# Patient Record
Sex: Male | Born: 1965
Health system: Southern US, Community
[De-identification: ages and names within clinical notes are randomized; demographics above are authoritative.]

## PROBLEM LIST (undated history)

## (undated) DIAGNOSIS — E7800 Pure hypercholesterolemia, unspecified: Secondary | ICD-10-CM

## (undated) DIAGNOSIS — E119 Type 2 diabetes mellitus without complications: Secondary | ICD-10-CM

---

## 1998-12-03 ENCOUNTER — Encounter: Payer: Self-pay | Admitting: Emergency Medicine

## 1998-12-03 ENCOUNTER — Emergency Department (HOSPITAL_COMMUNITY): Admission: EM | Admit: 1998-12-03 | Discharge: 1998-12-03 | Payer: Self-pay | Admitting: Emergency Medicine

## 1999-03-06 ENCOUNTER — Encounter: Payer: Self-pay | Admitting: Emergency Medicine

## 1999-03-06 ENCOUNTER — Emergency Department (HOSPITAL_COMMUNITY): Admission: EM | Admit: 1999-03-06 | Discharge: 1999-03-06 | Payer: Self-pay | Admitting: Emergency Medicine

## 1999-12-17 ENCOUNTER — Emergency Department (HOSPITAL_COMMUNITY): Admission: EM | Admit: 1999-12-17 | Discharge: 1999-12-17 | Payer: Self-pay | Admitting: Emergency Medicine

## 2001-09-09 ENCOUNTER — Emergency Department (HOSPITAL_COMMUNITY): Admission: EM | Admit: 2001-09-09 | Discharge: 2001-09-09 | Payer: Self-pay | Admitting: Emergency Medicine

## 2003-04-04 ENCOUNTER — Emergency Department (HOSPITAL_COMMUNITY): Admission: EM | Admit: 2003-04-04 | Discharge: 2003-04-04 | Payer: Self-pay | Admitting: Emergency Medicine

## 2003-04-04 ENCOUNTER — Encounter: Payer: Self-pay | Admitting: *Deleted

## 2003-07-10 ENCOUNTER — Emergency Department (HOSPITAL_COMMUNITY): Admission: EM | Admit: 2003-07-10 | Discharge: 2003-07-10 | Payer: Self-pay | Admitting: Emergency Medicine

## 2004-08-19 ENCOUNTER — Emergency Department (HOSPITAL_COMMUNITY): Admission: EM | Admit: 2004-08-19 | Discharge: 2004-08-19 | Payer: Self-pay | Admitting: Emergency Medicine

## 2005-01-02 ENCOUNTER — Emergency Department (HOSPITAL_COMMUNITY): Admission: EM | Admit: 2005-01-02 | Discharge: 2005-01-02 | Payer: Self-pay | Admitting: Emergency Medicine

## 2005-11-02 ENCOUNTER — Emergency Department (HOSPITAL_COMMUNITY): Admission: EM | Admit: 2005-11-02 | Discharge: 2005-11-02 | Payer: Self-pay | Admitting: Emergency Medicine

## 2006-06-14 ENCOUNTER — Emergency Department (HOSPITAL_COMMUNITY): Admission: EM | Admit: 2006-06-14 | Discharge: 2006-06-14 | Payer: Self-pay | Admitting: Emergency Medicine

## 2006-07-08 ENCOUNTER — Emergency Department (HOSPITAL_COMMUNITY): Admission: EM | Admit: 2006-07-08 | Discharge: 2006-07-08 | Payer: Self-pay | Admitting: Emergency Medicine

## 2007-04-14 ENCOUNTER — Emergency Department (HOSPITAL_COMMUNITY): Admission: EM | Admit: 2007-04-14 | Discharge: 2007-04-14 | Payer: Self-pay | Admitting: Emergency Medicine

## 2007-04-17 ENCOUNTER — Emergency Department (HOSPITAL_COMMUNITY): Admission: EM | Admit: 2007-04-17 | Discharge: 2007-04-17 | Payer: Self-pay | Admitting: Emergency Medicine

## 2009-12-20 ENCOUNTER — Emergency Department (HOSPITAL_COMMUNITY): Admission: EM | Admit: 2009-12-20 | Discharge: 2009-12-20 | Payer: Self-pay | Admitting: Emergency Medicine

## 2011-06-19 ENCOUNTER — Inpatient Hospital Stay (INDEPENDENT_AMBULATORY_CARE_PROVIDER_SITE_OTHER)
Admission: RE | Admit: 2011-06-19 | Discharge: 2011-06-19 | Disposition: A | Discharge status: Home / Self Care | Payer: Self-pay | Source: Ambulatory Visit | Attending: Emergency Medicine | Admitting: Emergency Medicine

## 2011-06-19 DIAGNOSIS — M5430 Sciatica, unspecified side: Secondary | ICD-10-CM

## 2011-06-20 LAB — URINALYSIS, ROUTINE W REFLEX MICROSCOPIC
Nitrite: NEGATIVE
Protein, ur: 30 — AB
Specific Gravity, Urine: 1.026
Urobilinogen, UA: 0.2

## 2011-06-20 LAB — URINE MICROSCOPIC-ADD ON

## 2014-12-03 ENCOUNTER — Encounter (HOSPITAL_COMMUNITY): Payer: Self-pay | Admitting: Emergency Medicine

## 2014-12-03 ENCOUNTER — Emergency Department (INDEPENDENT_AMBULATORY_CARE_PROVIDER_SITE_OTHER)
Admission: EM | Admit: 2014-12-03 | Discharge: 2014-12-03 | Disposition: A | Discharge status: Home / Self Care | Payer: 59 | Source: Home / Self Care | Attending: Family Medicine | Admitting: Family Medicine

## 2014-12-03 DIAGNOSIS — M5431 Sciatica, right side: Secondary | ICD-10-CM

## 2014-12-03 MED ORDER — KETOROLAC TROMETHAMINE 60 MG/2ML IM SOLN
60.0000 mg | Freq: Once | INTRAMUSCULAR | Status: AC
  Administered 2014-12-03: 60 mg via INTRAMUSCULAR

## 2014-12-03 MED ORDER — KETOROLAC TROMETHAMINE 60 MG/2ML IM SOLN
INTRAMUSCULAR | Status: AC
  Filled 2014-12-03: qty 2

## 2014-12-03 MED ORDER — HYDROCODONE-ACETAMINOPHEN 5-325 MG PO TABS: 1.0000 | ORAL_TABLET | Freq: Four times a day (QID) | ORAL | Status: DC | PRN

## 2014-12-03 MED ORDER — PREDNISONE 10 MG PO KIT
PACK | ORAL | Status: DC
Start: 2014-12-03 — End: 2018-03-30

## 2014-12-03 NOTE — ED Provider Notes (Signed)
David Yates is a 49 y.o. male who presents to Urgent Care today for right leg pain. Patient was playing softball today when he swung the bed and felt sharp pain in his right buttocks radiating to his right toe. He has a tingling sensation in his right toe. He notes the pain is quite severe. No weakness or numbness bowel bladder dysfunction. He has not tried any medications. He denies any injury.   History reviewed. No pertinent past medical history. History reviewed. No pertinent past surgical history. History  Substance Use Topics  . Smoking status: Never Smoker   . Smokeless tobacco: Not on file  . Alcohol Use: No   ROS as above Medications: No current facility-administered medications for this encounter.   Current Outpatient Prescriptions  Medication Sig Dispense Refill  . HYDROcodone-acetaminophen (NORCO/VICODIN) 5-325 MG per tablet Take 1 tablet by mouth every 6 (six) hours as needed. 15 tablet 0  . PredniSONE 10 MG KIT 12 day dose pack po 1 kit 0   No Known Allergies   Exam:  BP 126/82 mmHg  Pulse 73  Temp(Src) 98 F (36.7 C) (Oral)  Resp 20  SpO2 100% Gen: Well NAD Back: Nontender to spinal midline. Nontender lumbar spine Range of motion reduced flexion and extension due to pain. Positive right-sided straight leg raise test. Negative left. Negative Fabere bilaterally Reflexes are equal and normal bilateral lower extremities. Strength is intact throughout. Slow but normal gait.  No results found for this or any previous visit (from the past 24 hour(s)). No results found.  Assessment and Plan: 49 y.o. male with sciatica. Patient was given 60 mg IM Toradol. Additionally we'll treat with prednisone dose pack and Norco. Follow-up with sports medicine. Home exercise program.  Discussed warning signs or symptoms. Please see discharge instructions. Patient expresses understanding.     Gregor Hams, MD 12/03/14 2040

## 2014-12-03 NOTE — ED Notes (Signed)
Reports he pulled a muscle today around 1830 while playing softball States he swung for the ball and felt the pain Pain increases w/activity  Slow gait Alert, no signs of acute distress.

## 2014-12-03 NOTE — Discharge Instructions (Signed)
Thank you for coming in today. °Come back or go to the emergency room if you notice new weakness new numbness problems walking or bowel or bladder problems. ° °Sciatica °with Rehab °The sciatic nerve runs from the back down the leg and is responsible for sensation and control of the muscles in the back (posterior) side of the thigh, lower leg, and foot. Sciatica is a condition that is characterized by inflammation of this nerve.  °SYMPTOMS  °· Signs of nerve damage, including numbness and/or weakness along the posterior side of the lower extremity. °· Pain in the back of the thigh that may also travel down the leg. °· Pain that worsens when sitting for long periods of time. °· Occasionally, pain in the back or buttock. °CAUSES  °Inflammation of the sciatic nerve is the cause of sciatica. The inflammation is due to something irritating the nerve. Common sources of irritation include: °· Sitting for long periods of time. °· Direct trauma to the nerve. °· Arthritis of the spine. °· Herniated or ruptured disk. °· Slipping of the vertebrae (spondylolisthesis). °· Pressure from soft tissues, such as muscles or ligament-like tissue (fascia). °RISK INCREASES WITH: °· Sports that place pressure or stress on the spine (football or weightlifting). °· Poor strength and flexibility. °· Failure to warm up properly before activity. °· Family history of low back pain or disk disorders. °· Previous back injury or surgery. °· Poor body mechanics, especially when lifting, or poor posture. °PREVENTION  °· Warm up and stretch properly before activity. °· Maintain physical fitness: °¨ Strength, flexibility, and endurance. °· Cardiovascular fitness. °· Learn and use proper technique, especially with posture and lifting. When possible, have coach correct improper technique. °· Avoid activities that place stress on the spine. °PROGNOSIS °If treated properly, then sciatica usually resolves within 6 weeks. However, occasionally surgery is  necessary.  °RELATED COMPLICATIONS  °· Permanent nerve damage, including pain, numbness, tingle, or weakness. °· Chronic back pain. °· Risks of surgery: infection, bleeding, nerve damage, or damage to surrounding tissues. °TREATMENT °Treatment initially involves resting from any activities that aggravate your symptoms. The use of ice and medication may help reduce pain and inflammation. The use of strengthening and stretching exercises may help reduce pain with activity. These exercises may be performed at home or with referral to a therapist. A therapist may recommend further treatments, such as transcutaneous electronic nerve stimulation (TENS) or ultrasound. Your caregiver may recommend corticosteroid injections to help reduce inflammation of the sciatic nerve. If symptoms persist despite non-surgical (conservative) treatment, then surgery may be recommended. °MEDICATION °· If pain medication is necessary, then nonsteroidal anti-inflammatory medications, such as aspirin and ibuprofen, or other minor pain relievers, such as acetaminophen, are often recommended. °· Do not take pain medication for 7 days before surgery. °· Prescription pain relievers may be given if deemed necessary by your caregiver. Use only as directed and only as much as you need. °· Ointments applied to the skin may be helpful. °· Corticosteroid injections may be given by your caregiver. These injections should be reserved for the most serious cases, because they may only be given a certain number of times. °HEAT AND COLD °· Cold treatment (icing) relieves pain and reduces inflammation. Cold treatment should be applied for 10 to 15 minutes every 2 to 3 hours for inflammation and pain and immediately after any activity that aggravates your symptoms. Use ice packs or massage the area with a piece of ice (ice massage). °· Heat treatment may be   used prior to performing the stretching and strengthening activities prescribed by your caregiver,  physical therapist, or athletic trainer. Use a heat pack or soak the injury in warm water. °SEEK MEDICAL CARE IF: °· Treatment seems to offer no benefit, or the condition worsens. °· Any medications produce adverse side effects. °EXERCISES  °RANGE OF MOTION (ROM) AND STRETCHING EXERCISES - Sciatica °Most people with sciatic will find that their symptoms worsen with either excessive bending forward (flexion) or arching at the low back (extension). The exercises which will help resolve your symptoms will focus on the opposite motion. Your physician, physical therapist or athletic trainer will help you determine which exercises will be most helpful to resolve your low back pain. Do not complete any exercises without first consulting with your clinician. Discontinue any exercises which worsen your symptoms until you speak to your clinician. If you have pain, numbness or tingling which travels down into your buttocks, leg or foot, the goal of the therapy is for these symptoms to move closer to your back and eventually resolve. Occasionally, these leg symptoms will get better, but your low back pain may worsen; this is typically an indication of progress in your rehabilitation. Be certain to be very alert to any changes in your symptoms and the activities in which you participated in the 24 hours prior to the change. Sharing this information with your clinician will allow him/her to most efficiently treat your condition. °These exercises may help you when beginning to rehabilitate your injury. Your symptoms may resolve with or without further involvement from your physician, physical therapist or athletic trainer. While completing these exercises, remember:  °· Restoring tissue flexibility helps normal motion to return to the joints. This allows healthier, less painful movement and activity. °· An effective stretch should be held for at least 30 seconds. °· A stretch should never be painful. You should only feel a gentle  lengthening or release in the stretched tissue. °FLEXION RANGE OF MOTION AND STRETCHING EXERCISES: °STRETCH - Flexion, Single Knee to Chest  °· Lie on a firm bed or floor with both legs extended in front of you. °· Keeping one leg in contact with the floor, bring your opposite knee to your chest. Hold your leg in place by either grabbing behind your thigh or at your knee. °· Pull until you feel a gentle stretch in your low back. Hold __________ seconds. °· Slowly release your grasp and repeat the exercise with the opposite side. °Repeat __________ times. Complete this exercise __________ times per day.  °STRETCH - Flexion, Double Knee to Chest °· Lie on a firm bed or floor with both legs extended in front of you. °· Keeping one leg in contact with the floor, bring your opposite knee to your chest. °· Tense your stomach muscles to support your back and then lift your other knee to your chest. Hold your legs in place by either grabbing behind your thighs or at your knees. °· Pull both knees toward your chest until you feel a gentle stretch in your low back. Hold __________ seconds. °· Tense your stomach muscles and slowly return one leg at a time to the floor. °Repeat __________ times. Complete this exercise __________ times per day.  °STRETCH - Low Trunk Rotation  °· Lie on a firm bed or floor. Keeping your legs in front of you, bend your knees so they are both pointed toward the ceiling and your feet are flat on the floor. °· Extend your arms out to   the side. This will stabilize your upper body by keeping your shoulders in contact with the floor. °· Gently and slowly drop both knees together to one side until you feel a gentle stretch in your low back. Hold for __________ seconds. °· Tense your stomach muscles to support your low back as you bring your knees back to the starting position. Repeat the exercise to the other side. °Repeat __________ times. Complete this exercise __________ times per day  °EXTENSION  RANGE OF MOTION AND FLEXIBILITY EXERCISES: °STRETCH - Extension, Prone on Elbows °· Lie on your stomach on the floor, a bed will be too soft. Place your palms about shoulder width apart and at the height of your head. °· Place your elbows under your shoulders. If this is too painful, stack pillows under your chest. °· Allow your body to relax so that your hips drop lower and make contact more completely with the floor. °· Hold this position for __________ seconds. °· Slowly return to lying flat on the floor. °Repeat __________ times. Complete this exercise __________ times per day.  °RANGE OF MOTION - Extension, Prone Press Ups °· Lie on your stomach on the floor, a bed will be too soft. Place your palms about shoulder width apart and at the height of your head. °· Keeping your back as relaxed as possible, slowly straighten your elbows while keeping your hips on the floor. You may adjust the placement of your hands to maximize your comfort. As you gain motion, your hands will come more underneath your shoulders. °· Hold this position __________ seconds. °· Slowly return to lying flat on the floor. °Repeat __________ times. Complete this exercise __________ times per day.  °STRENGTHENING EXERCISES - Sciatica  °These exercises may help you when beginning to rehabilitate your injury. These exercises should be done near your "sweet spot." This is the neutral, low-back arch, somewhere between fully rounded and fully arched, that is your least painful position. When performed in this safe range of motion, these exercises can be used for people who have either a flexion or extension based injury. These exercises may resolve your symptoms with or without further involvement from your physician, physical therapist or athletic trainer. While completing these exercises, remember:  °· Muscles can gain both the endurance and the strength needed for everyday activities through controlled exercises. °· Complete these exercises as  instructed by your physician, physical therapist or athletic trainer. Progress with the resistance and repetition exercises only as your caregiver advises. °· You may experience muscle soreness or fatigue, but the pain or discomfort you are trying to eliminate should never worsen during these exercises. If this pain does worsen, stop and make certain you are following the directions exactly. If the pain is still present after adjustments, discontinue the exercise until you can discuss the trouble with your clinician. °STRENGTHENING - Deep Abdominals, Pelvic Tilt  °· Lie on a firm bed or floor. Keeping your legs in front of you, bend your knees so they are both pointed toward the ceiling and your feet are flat on the floor. °· Tense your lower abdominal muscles to press your low back into the floor. This motion will rotate your pelvis so that your tail bone is scooping upwards rather than pointing at your feet or into the floor. °· With a gentle tension and even breathing, hold this position for __________ seconds. °Repeat __________ times. Complete this exercise __________ times per day.  °STRENGTHENING - Abdominals, Crunches  °· Lie on a   firm bed or floor. Keeping your legs in front of you, bend your knees so they are both pointed toward the ceiling and your feet are flat on the floor. Cross your arms over your chest. °· Slightly tip your chin down without bending your neck. °· Tense your abdominals and slowly lift your trunk high enough to just clear your shoulder blades. Lifting higher can put excessive stress on the low back and does not further strengthen your abdominal muscles. °· Control your return to the starting position. °Repeat __________ times. Complete this exercise __________ times per day.  °STRENGTHENING - Quadruped, Opposite UE/LE Lift °· Assume a hands and knees position on a firm surface. Keep your hands under your shoulders and your knees under your hips. You may place padding under your knees  for comfort. °· Find your neutral spine and gently tense your abdominal muscles so that you can maintain this position. Your shoulders and hips should form a rectangle that is parallel with the floor and is not twisted. °· Keeping your trunk steady, lift your right hand no higher than your shoulder and then your left leg no higher than your hip. Make sure you are not holding your breath. Hold this position __________ seconds. °· Continuing to keep your abdominal muscles tense and your back steady, slowly return to your starting position. Repeat with the opposite arm and leg. °Repeat __________ times. Complete this exercise __________ times per day.  °STRENGTHENING - Abdominals and Quadriceps, Straight Leg Raise  °· Lie on a firm bed or floor with both legs extended in front of you. °· Keeping one leg in contact with the floor, bend the other knee so that your foot can rest flat on the floor. °· Find your neutral spine, and tense your abdominal muscles to maintain your spinal position throughout the exercise. °· Slowly lift your straight leg off the floor about 6 inches for a count of 15, making sure to not hold your breath. °· Still keeping your neutral spine, slowly lower your leg all the way to the floor. °Repeat this exercise with each leg __________ times. Complete this exercise __________ times per day. °POSTURE AND BODY MECHANICS CONSIDERATIONS - Sciatica °Keeping correct posture when sitting, standing or completing your activities will reduce the stress put on different body tissues, allowing injured tissues a chance to heal and limiting painful experiences. The following are general guidelines for improved posture. Your physician or physical therapist will provide you with any instructions specific to your needs. While reading these guidelines, remember: °· The exercises prescribed by your provider will help you have the flexibility and strength to maintain correct postures. °· The correct posture provides  the optimal environment for your joints to work. All of your joints have less wear and tear when properly supported by a spine with good posture. This means you will experience a healthier, less painful body. °· Correct posture must be practiced with all of your activities, especially prolonged sitting and standing. Correct posture is as important when doing repetitive low-stress activities (typing) as it is when doing a single heavy-load activity (lifting). °RESTING POSITIONS °Consider which positions are most painful for you when choosing a resting position. If you have pain with flexion-based activities (sitting, bending, stooping, squatting), choose a position that allows you to rest in a less flexed posture. You would want to avoid curling into a fetal position on your side. If your pain worsens with extension-based activities (prolonged standing, working overhead), avoid resting in an extended   position such as sleeping on your stomach. Most people will find more comfort when they rest with their spine in a more neutral position, neither too rounded nor too arched. Lying on a non-sagging bed on your side with a pillow between your knees, or on your back with a pillow under your knees will often provide some relief. Keep in mind, being in any one position for a prolonged period of time, no matter how correct your posture, can still lead to stiffness. °PROPER SITTING POSTURE °In order to minimize stress and discomfort on your spine, you must sit with correct posture Sitting with good posture should be effortless for a healthy body. Returning to good posture is a gradual process. Many people can work toward this most comfortably by using various supports until they have the flexibility and strength to maintain this posture on their own. °When sitting with proper posture, your ears will fall over your shoulders and your shoulders will fall over your hips. You should use the back of the chair to support your upper  back. Your low back will be in a neutral position, just slightly arched. You may place a small pillow or folded towel at the base of your low back for support.  °When working at a desk, create an environment that supports good, upright posture. Without extra support, muscles fatigue and lead to excessive strain on joints and other tissues. Keep these recommendations in mind: °CHAIR:  °· A chair should be able to slide under your desk when your back makes contact with the back of the chair. This allows you to work closely. °· The chair's height should allow your eyes to be level with the upper part of your monitor and your hands to be slightly lower than your elbows. °BODY POSITION °· Your feet should make contact with the floor. If this is not possible, use a foot rest. °· Keep your ears over your shoulders. This will reduce stress on your neck and low back. °INCORRECT SITTING POSTURES  °· If you are feeling tired and unable to assume a healthy sitting posture, do not slouch or slump. This puts excessive strain on your back tissues, causing more damage and pain. Healthier options include: °· Using more support, like a lumbar pillow. °· Switching tasks to something that requires you to be upright or walking. °· Talking a brief walk. °· Lying down to rest in a neutral-spine position. °PROLONGED STANDING WHILE SLIGHTLY LEANING FORWARD  °When completing a task that requires you to lean forward while standing in one place for a long time, place either foot up on a stationary 2-4 inch high object to help maintain the best posture. When both feet are on the ground, the low back tends to lose its slight inward curve. If this curve flattens (or becomes too large), then the back and your other joints will experience too much stress, fatigue more quickly and can cause pain.  °CORRECT STANDING POSTURES °Proper standing posture should be assumed with all daily activities, even if they only take a few moments, like when brushing  your teeth. As in sitting, your ears should fall over your shoulders and your shoulders should fall over your hips. You should keep a slight tension in your abdominal muscles to brace your spine. Your tailbone should point down to the ground, not behind your body, resulting in an over-extended swayback posture.  °INCORRECT STANDING POSTURES  °Common incorrect standing postures include a forward head, locked knees and/or an excessive swayback. °WALKING °  Walk with an upright posture. Your ears, shoulders and hips should all line-up. °PROLONGED ACTIVITY IN A FLEXED POSITION °When completing a task that requires you to bend forward at your waist or lean over a low surface, try to find a way to stabilize 3 of 4 of your limbs. You can place a hand or elbow on your thigh or rest a knee on the surface you are reaching across. This will provide you more stability so that your muscles do not fatigue as quickly. By keeping your knees relaxed, or slightly bent, you will also reduce stress across your low back. °CORRECT LIFTING TECHNIQUES °DO :  °· Assume a wide stance. This will provide you more stability and the opportunity to get as close as possible to the object which you are lifting. °· Tense your abdominals to brace your spine; then bend at the knees and hips. Keeping your back locked in a neutral-spine position, lift using your leg muscles. Lift with your legs, keeping your back straight. °· Test the weight of unknown objects before attempting to lift them. °· Try to keep your elbows locked down at your sides in order get the best strength from your shoulders when carrying an object. °· Always ask for help when lifting heavy or awkward objects. °INCORRECT LIFTING TECHNIQUES °DO NOT:  °· Lock your knees when lifting, even if it is a small object. °· Bend and twist. Pivot at your feet or move your feet when needing to change directions. °· Assume that you cannot safely pick up a paperclip without proper posture. °Document  Released: 08/22/2005 Document Revised: 01/06/2014 Document Reviewed: 12/04/2008 °[employer]® Patient Information ©2015 [employer]. This information is not intended to replace advice given to you by your health care provider. Make sure you discuss any questions you have with your health care provider. ° °

## 2015-03-20 ENCOUNTER — Encounter (HOSPITAL_COMMUNITY): Payer: Self-pay | Admitting: Emergency Medicine

## 2015-03-20 ENCOUNTER — Emergency Department (HOSPITAL_COMMUNITY): Payer: 59

## 2015-03-20 ENCOUNTER — Emergency Department (HOSPITAL_COMMUNITY)
Admission: EM | Admit: 2015-03-20 | Discharge: 2015-03-20 | Disposition: A | Discharge status: Home / Self Care | Payer: 59 | Attending: Emergency Medicine | Admitting: Emergency Medicine

## 2015-03-20 DIAGNOSIS — S46911A Strain of unspecified muscle, fascia and tendon at shoulder and upper arm level, right arm, initial encounter: Secondary | ICD-10-CM | POA: Insufficient documentation

## 2015-03-20 DIAGNOSIS — E876 Hypokalemia: Secondary | ICD-10-CM | POA: Insufficient documentation

## 2015-03-20 DIAGNOSIS — Y9389 Activity, other specified: Secondary | ICD-10-CM | POA: Insufficient documentation

## 2015-03-20 DIAGNOSIS — Y9289 Other specified places as the place of occurrence of the external cause: Secondary | ICD-10-CM | POA: Insufficient documentation

## 2015-03-20 DIAGNOSIS — N179 Acute kidney failure, unspecified: Secondary | ICD-10-CM

## 2015-03-20 DIAGNOSIS — Y990 Civilian activity done for income or pay: Secondary | ICD-10-CM | POA: Insufficient documentation

## 2015-03-20 DIAGNOSIS — E860 Dehydration: Secondary | ICD-10-CM

## 2015-03-20 DIAGNOSIS — Z79899 Other long term (current) drug therapy: Secondary | ICD-10-CM | POA: Insufficient documentation

## 2015-03-20 DIAGNOSIS — X58XXXA Exposure to other specified factors, initial encounter: Secondary | ICD-10-CM | POA: Insufficient documentation

## 2015-03-20 DIAGNOSIS — R252 Cramp and spasm: Secondary | ICD-10-CM

## 2015-03-20 LAB — I-STAT CHEM 8, ED
BUN: 40 mg/dL — AB (ref 6–20)
BUN: 32 mg/dL — ABNORMAL HIGH (ref 6–20)
CALCIUM ION: 1.13 mmol/L (ref 1.12–1.23)
CHLORIDE: 94 mmol/L — AB (ref 101–111)
CHLORIDE: 97 mmol/L — AB (ref 101–111)
CREATININE: 1.9 mg/dL — AB (ref 0.61–1.24)
CREATININE: 1.5 mg/dL — AB (ref 0.61–1.24)
Calcium, Ion: 1.18 mmol/L (ref 1.12–1.23)
Glucose, Bld: 163 mg/dL — ABNORMAL HIGH (ref 65–99)
Glucose, Bld: 112 mg/dL — ABNORMAL HIGH (ref 65–99)
HEMATOCRIT: 54 % — AB (ref 39.0–52.0)
HEMATOCRIT: 41 % (ref 39.0–52.0)
Hemoglobin: 18.4 g/dL — ABNORMAL HIGH (ref 13.0–17.0)
Hemoglobin: 13.9 g/dL (ref 13.0–17.0)
POTASSIUM: 3.8 mmol/L (ref 3.5–5.1)
Potassium: 3.3 mmol/L — ABNORMAL LOW (ref 3.5–5.1)
Sodium: 133 mmol/L — ABNORMAL LOW (ref 135–145)
Sodium: 134 mmol/L — ABNORMAL LOW (ref 135–145)
TCO2: 26 mmol/L (ref 0–100)
TCO2: 25 mmol/L (ref 0–100)

## 2015-03-20 LAB — CK: CK TOTAL: 2037 U/L — AB (ref 49–397)

## 2015-03-20 MED ORDER — SODIUM CHLORIDE 0.9 % IV BOLUS (SEPSIS)
1000.0000 mL | Freq: Once | INTRAVENOUS | Status: AC
  Administered 2015-03-20: 1000 mL via INTRAVENOUS

## 2015-03-20 MED ORDER — CYCLOBENZAPRINE HCL 10 MG PO TABS
5.0000 mg | ORAL_TABLET | Freq: Once | ORAL | Status: AC
  Administered 2015-03-20: 5 mg via ORAL
  Filled 2015-03-20: qty 1

## 2015-03-20 MED ORDER — CYCLOBENZAPRINE HCL 10 MG PO TABS: 10.0000 mg | ORAL_TABLET | Freq: Two times a day (BID) | ORAL | Status: DC | PRN

## 2015-03-20 MED ORDER — MORPHINE SULFATE 4 MG/ML IJ SOLN: 6.0000 mg | Freq: Once | INTRAMUSCULAR | Status: DC

## 2015-03-20 MED ORDER — POTASSIUM CHLORIDE CRYS ER 20 MEQ PO TBCR: 20.0000 meq | EXTENDED_RELEASE_TABLET | Freq: Every day | ORAL | Status: DC

## 2015-03-20 MED ORDER — MORPHINE SULFATE 4 MG/ML IJ SOLN
4.0000 mg | Freq: Once | INTRAMUSCULAR | Status: AC
  Administered 2015-03-20: 4 mg via INTRAVENOUS
  Filled 2015-03-20: qty 1

## 2015-03-20 MED ORDER — POTASSIUM CHLORIDE CRYS ER 20 MEQ PO TBCR
40.0000 meq | EXTENDED_RELEASE_TABLET | Freq: Once | ORAL | Status: AC
  Administered 2015-03-20: 40 meq via ORAL
  Filled 2015-03-20: qty 2

## 2015-03-20 MED ORDER — HYDROCODONE-ACETAMINOPHEN 5-325 MG PO TABS: 1.0000 | ORAL_TABLET | Freq: Four times a day (QID) | ORAL | Status: DC | PRN

## 2015-03-20 NOTE — ED Notes (Signed)
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 Pt states he got some muscle relaxer at home with no relief.

## 2015-03-20 NOTE — ED Provider Notes (Addendum)
CSN: [id number]     Arrival date & time 03/20/15  4098 History   First MD Initiated Contact with Patient 03/20/15 [phone]     Chief Complaint  Patient presents with  . Shoulder Pain     (Consider location/radiation/quality/duration/timing/severity/associated sxs/prior Treatment) Patient is a 49 y.o. male presenting with shoulder pain. The history is provided by the patient. No language interpreter was used.  Shoulder Pain Location:  Shoulder Injury: no   Shoulder location:  R shoulder Pain details:    Quality:  Dull and aching   Radiates to:  R arm   Severity:  Moderate   Duration:  2 days   Timing:  Constant   Progression:  Worsening Chronicity:  New Handedness:  Right-handed Dislocation: no   Foreign body present:  No foreign bodies Tetanus status:  Unknown Prior injury to area:  No Relieved by:  Rest Worsened by:  Movement Ineffective treatments:  Muscle relaxant Associated symptoms: decreased range of motion   Associated symptoms: no back pain, no fatigue, no fever, no muscle weakness, no neck pain, no numbness, no stiffness, no swelling and no tingling   Associated symptoms comment:  Muscle cramping Risk factors: no concern for non-accidental trauma, no known bone disorder, no frequent fractures and no recent illness     History reviewed. No pertinent past medical history. History reviewed. No pertinent past surgical history. No family history on file. History  Substance Use Topics  . Smoking status: Never Smoker   . Smokeless tobacco: Not on file  . Alcohol Use: No    Review of Systems  Constitutional: Negative for fever, activity change, appetite change and fatigue.  HENT: Negative for congestion, facial swelling, rhinorrhea and trouble swallowing.   Eyes: Negative for photophobia and pain.  Respiratory: Negative for cough, chest tightness and shortness of breath.   Cardiovascular: Negative for chest pain and leg swelling.  Gastrointestinal: Negative for  nausea, vomiting, abdominal pain, diarrhea and constipation.  Endocrine: Negative for polydipsia and polyuria.  Genitourinary: Negative for dysuria, urgency, decreased urine volume and difficulty urinating.  Musculoskeletal: Positive for arthralgias. Negative for back pain, gait problem, stiffness and neck pain.       Muscle cramping  Skin: Negative for color change, rash and wound.  Allergic/Immunologic: Negative for immunocompromised state.  Neurological: Negative for dizziness, facial asymmetry, speech difficulty, weakness, numbness and headaches.  Psychiatric/Behavioral: Negative for confusion, decreased concentration and agitation.  All other systems reviewed and are negative.     Allergies  Review of patient's allergies indicates no known allergies.  Home Medications   Prior to Admission medications   Medication Sig Start Date End Date Taking? Authorizing Provider  cyclobenzaprine (FLEXERIL) 10 MG tablet Take 1 tablet (10 mg total) by mouth 2 (two) times daily as needed for muscle spasms. 03/20/15   Ernestina Patches, MD  HYDROcodone-acetaminophen (NORCO) 5-325 MG per tablet Take 1 tablet by mouth every 6 (six) hours as needed. 03/20/15   Ernestina Patches, MD  potassium chloride SA (K-DUR,KLOR-CON) 20 MEQ tablet Take 1 tablet (20 mEq total) by mouth daily. 03/20/15   Ernestina Patches, MD  PredniSONE 10 MG KIT 12 day dose pack po 12/03/14   Gregor Hams, MD   BP 130/81 mmHg  Pulse 55  Temp(Src) 97.8 F (36.6 C) (Oral)  Resp 20  Ht 6' (1.829 m)  Wt 220 lb (99.791 kg)  BMI 29.83 kg/m2  SpO2 99% Physical Exam  Constitutional: He is oriented to person, place, and time. He appears well-developed  and well-nourished. No distress.  HENT:  Head: Normocephalic and atraumatic.  Mouth/Throat: No oropharyngeal exudate.  Eyes: Pupils are equal, round, and reactive to light.  Neck: Normal range of motion. Neck supple.  Cardiovascular: Normal rate, regular rhythm and normal heart sounds.  Exam  reveals no gallop and no friction rub.   No murmur heard. Pulmonary/Chest: Effort normal and breath sounds normal. No respiratory distress. He has no wheezes. He has no rales.  Abdominal: Soft. Bowel sounds are normal. He exhibits no distension and no mass. There is no tenderness. There is no rebound and no guarding.  Musculoskeletal: He exhibits no edema or tenderness.       Right shoulder: He exhibits decreased range of motion. He exhibits no tenderness, no bony tenderness, no swelling and no effusion.  Neurological: He is alert and oriented to person, place, and time.  Skin: Skin is warm and dry.  Psychiatric: He has a normal mood and affect.  Vitals reviewed.   ED Course  Procedures (including critical care time) Labs Review Labs Reviewed  CK - Abnormal; Notable for the following:    Total CK 2037 (*)    All other components within normal limits  I-STAT CHEM 8, ED - Abnormal; Notable for the following:    Sodium 133 (*)    Potassium 3.3 (*)    Chloride 94 (*)    BUN 40 (*)    Creatinine, Ser 1.90 (*)    Glucose, Bld 163 (*)    Hemoglobin 18.4 (*)    HCT 54.0 (*)    All other components within normal limits  I-STAT CHEM 8, ED - Abnormal; Notable for the following:    Sodium 134 (*)    Chloride 97 (*)    BUN 32 (*)    Creatinine, Ser 1.50 (*)    Glucose, Bld 112 (*)    All other components within normal limits    Imaging Review Dg Shoulder Right  03/20/2015   CLINICAL DATA:  Pain after heavy work.  No discrete trauma history.  EXAM: RIGHT SHOULDER - 2+ VIEW  COMPARISON:  None.  FINDINGS: Frontal, Y scapular, and axillary images were obtained. There is no fracture or dislocation. There is slight osteoarthritic change in the glenohumeral and acromioclavicular joints. No erosive change.  IMPRESSION: Slight osteoarthritic change.  No fracture or dislocation.   Electronically Signed   By: Lowella Grip III M.D.   On: 03/20/2015 07:31     EKG Interpretation None       MDM   Final diagnoses:  Right shoulder strain, initial encounter  Muscle cramping  Dehydration  Hypokalemia  AKI (acute kidney injury)    Pt is a 49 y.o. male with Pmhx as above who presents with 2 days of right shoulder pain as well as muscle cramping.  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  He states he drinks some water but hasn't been paying as much as he normally does.  He's had no known injury to the shoulder.  Has had no falls.  Took a muscle relaxer last night with some improvement.  On physical exam, vital signs are stable and he is in no acute distress.  He has pain with flexion and internal and external rotation of the right shoulder.  No bony tenderness.  He is having muscle spasms in his arms during the exam.  Cardiac, pulmonary exam is benign.  Given the significant  heat over the last several days.  I have ordered screening CK and i-STAT BMP.  X-ray of left shoulder is negative.  I suspect muscular sprain or strain.    Creatinine improved after 2 L normal saline.  I will have [facility] for recheck.  We've discussed appropriate hydration at work.  Also give 3 days of potassium replacement.  Derrek Gu Faro evaluation in the Emergency Department is complete. It has been determined that no acute conditions requiring further emergency intervention are present at this time. The patient/guardian have been advised of the diagnosis and plan. We have discussed signs and symptoms that warrant return to the ED, such as changes or worsening in symptoms,  Worsening pain, fever, decreased urine output      Ernestina Patches, MD 03/20/15 1215  Ernestina Patches, MD 03/20/15 1215

## 2015-03-20 NOTE — Discharge Instructions (Signed)
Hypokalemia Hypokalemia means that the amount of potassium in the blood is lower than normal.Potassium is a chemical, called an electrolyte, that helps regulate the amount of fluid in the body. It also stimulates muscle contraction and helps nerves function properly.Most of the body's potassium is inside of cells, and only a very small amount is in the blood. Because the amount in the blood is so small, minor changes can be life-threatening. CAUSES  Antibiotics.  Diarrhea or vomiting.  Using laxatives too much, which can cause diarrhea.  Chronic kidney disease.  Water pills (diuretics).  Eating disorders (bulimia).  Low magnesium level.  Sweating a lot. SIGNS AND SYMPTOMS  Weakness.  Constipation.  Fatigue.  Muscle cramps.  Mental confusion.  Skipped heartbeats or irregular heartbeat (palpitations).  Tingling or numbness. DIAGNOSIS  Your health care provider can diagnose hypokalemia with blood tests. In addition to checking your potassium level, your health care provider may also check other lab tests. TREATMENT Hypokalemia can be treated with potassium supplements taken by mouth or adjustments in your current medicines. If your potassium level is very low, you may need to get potassium through a vein (IV) and be monitored in the hospital. A diet high in potassium is also helpful. Foods high in potassium are:  Nuts, such as peanuts and pistachios.  Seeds, such as sunflower seeds and pumpkin seeds.  Peas, lentils, and lima beans.  Whole grain and bran cereals and breads.  Fresh fruit and vegetables, such as apricots, avocado, bananas, cantaloupe, kiwi, oranges, tomatoes, asparagus, and potatoes.  Orange and tomato juices.  Red meats.  Fruit yogurt. HOME CARE INSTRUCTIONS  Take all medicines as prescribed by your health care provider.  Maintain a healthy diet by including nutritious food, such as fruits, vegetables, nuts, whole grains, and lean meats.  If  you are taking a laxative, be sure to follow the directions on the label. SEEK MEDICAL CARE IF:  Your weakness gets worse.  You feel your heart pounding or racing.  You are vomiting or having diarrhea.  You are diabetic and having trouble keeping your blood glucose in the normal range. SEEK IMMEDIATE MEDICAL CARE IF:  You have chest pain, shortness of breath, or dizziness.  You are vomiting or having diarrhea for more than 2 days.  You faint. MAKE SURE YOU:   Understand these instructions.  Will watch your condition.  Will get help right away if you are not doing well or get worse. Document Released: 08/22/2005 Document Revised: 06/12/2013 Document Reviewed: 02/22/2013 [facility] Patient Information 2015 [place], [place]. This information is not intended to replace advice given to you by your health care provider. Make sure you discuss any questions you have with your health care provider.   Dehydration, Adult Dehydration is when you lose more fluids from the body than you take in. Vital organs like the kidneys, brain, and heart cannot function without a proper amount of fluids and salt. Any loss of fluids from the body can cause dehydration.  CAUSES   Vomiting.  Diarrhea.  Excessive sweating.  Excessive urine output.  Fever. SYMPTOMS  Mild dehydration  Thirst.  Dry lips.  Slightly dry mouth. Moderate dehydration  Very dry mouth.  Sunken eyes.  Skin does not bounce back quickly when lightly pinched and released.  Dark urine and decreased urine production.  Decreased tear production.  Headache. Severe dehydration  Very dry mouth.  Extreme thirst.  Rapid, weak pulse (more than 100 beats per minute at rest).  Cold hands and  feet.  Not able to sweat in spite of heat and temperature.  Rapid breathing.  Blue lips.  Confusion and lethargy.  Difficulty being awakened.  Minimal urine production.  No tears. DIAGNOSIS  Your caregiver will  diagnose dehydration based on your symptoms and your exam. Blood and urine tests will help confirm the diagnosis. The diagnostic evaluation should also identify the cause of dehydration. TREATMENT  Treatment of mild or moderate dehydration can often be done at home by increasing the amount of fluids that you drink. It is best to drink small amounts of fluid more often. Drinking too much at one time can make vomiting worse. Refer to the home care instructions below. Severe dehydration needs to be treated at the hospital where you will probably be given intravenous (IV) fluids that contain water and electrolytes. HOME CARE INSTRUCTIONS   Ask your caregiver about specific rehydration instructions.  Drink enough fluids to keep your urine clear or pale yellow.  Drink small amounts frequently if you have nausea and vomiting.  Eat as you normally do.  Avoid:  Foods or drinks high in sugar.  Carbonated drinks.  Juice.  Extremely hot or cold fluids.  Drinks with caffeine.  Fatty, greasy foods.  Alcohol.  Tobacco.  Overeating.  Gelatin desserts.  Wash your hands well to avoid spreading bacteria and viruses.  Only take over-the-counter or prescription medicines for pain, discomfort, or fever as directed by your caregiver.  Ask your caregiver if you should continue all prescribed and over-the-counter medicines.  Keep all follow-up appointments with your caregiver. SEEK MEDICAL CARE IF:  You have abdominal pain and it increases or stays in one area (localizes).  You have a rash, stiff neck, or severe headache.  You are irritable, sleepy, or difficult to awaken.  You are weak, dizzy, or extremely thirsty. SEEK IMMEDIATE MEDICAL CARE IF:   You are unable to keep fluids down or you get worse despite treatment.  You have frequent episodes of vomiting or diarrhea.  You have blood or green matter (bile) in your vomit.  You have blood in your stool or your stool looks black  and tarry.  You have not urinated in 6 to 8 hours, or you have only urinated a small amount of very dark urine.  You have a fever.  You faint. MAKE SURE YOU:   Understand these instructions.  Will watch your condition.  Will get help right away if you are not doing well or get worse. Document Released: 08/22/2005 Document Revised: 11/14/2011 Document Reviewed: 04/11/2011 [facility] Patient Information 2015 [place], [place]. This information is not intended to replace advice given to you by your health care provider. Make sure you discuss any questions you have with your health care provider. Shoulder Pain The shoulder is the joint that connects your arms to your body. The bones that form the shoulder joint include the upper arm bone (humerus), the shoulder blade (scapula), and the collarbone (clavicle). The top of the humerus is shaped like a ball and fits into a rather flat socket on the scapula (glenoid cavity). A combination of muscles and strong, fibrous tissues that connect muscles to bones (tendons) support your shoulder joint and hold the ball in the socket. Small, fluid-filled sacs (bursae) are located in different areas of the joint. They act as cushions between the bones and the overlying soft tissues and help reduce friction between the gliding tendons and the bone as you move your arm. Your shoulder joint allows a wide range of  motion in your arm. This range of motion allows you to do things like scratch your back or throw a ball. However, this range of motion also makes your shoulder more prone to pain from overuse and injury. Causes of shoulder pain can originate from both injury and overuse and usually can be grouped in the following four categories:  Redness, swelling, and pain (inflammation) of the tendon (tendinitis) or the bursae (bursitis).  Instability, such as a dislocation of the joint.  Inflammation of the joint (arthritis).  Broken bone (fracture). HOME CARE  INSTRUCTIONS   Apply ice to the sore area.  Put ice in a plastic bag.  Place a towel between your skin and the bag.  Leave the ice on for 15-20 minutes, 3-4 times per day for the first 2 days, or as directed by your health care provider.  Stop using cold packs if they do not help with the pain.  If you have a shoulder sling or immobilizer, wear it as long as your caregiver instructs. Only remove it to shower or bathe. Move your arm as little as possible, but keep your hand moving to prevent swelling.  Squeeze a soft ball or foam pad as much as possible to help prevent swelling.  Only take over-the-counter or prescription medicines for pain, discomfort, or fever as directed by your caregiver. SEEK MEDICAL CARE IF:   Your shoulder pain increases, or new pain develops in your arm, hand, or fingers.  Your hand or fingers become cold and numb.  Your pain is not relieved with medicines. SEEK IMMEDIATE MEDICAL CARE IF:   Your arm, hand, or fingers are numb or tingling.  Your arm, hand, or fingers are significantly swollen or turn B[place] or blue. MAKE SURE YOU:   Understand these instructions.  Will watch your condition.  Will get help right away if you are not doing well or get worse. Document Released: 06/01/2005 Document Revised: 01/06/2014 Document Reviewed: 08/06/2011 [facility] Patient Information 2015 [place], [place]. This information is not intended to replace advice given to you by your health care provider. Make sure you discuss any questions you have with your health care provider.   Hypokalemia Hypokalemia means that the amount of potassium in the blood is lower than normal.Potassium is a chemical, called an electrolyte, that helps regulate the amount of fluid in the body. It also stimulates muscle contraction and helps nerves function properly.Most of the body's potassium is inside of cells, and only a very small amount is in the blood. Because the amount in the blood is  so small, minor changes can be life-threatening. CAUSES  Antibiotics.  Diarrhea or vomiting.  Using laxatives too much, which can cause diarrhea.  Chronic kidney disease.  Water pills (diuretics).  Eating disorders (bulimia).  Low magnesium level.  Sweating a lot. SIGNS AND SYMPTOMS  Weakness.  Constipation.  Fatigue.  Muscle cramps.  Mental confusion.  Skipped heartbeats or irregular heartbeat (palpitations).  Tingling or numbness. DIAGNOSIS  Your health care provider can diagnose hypokalemia with blood tests. In addition to checking your potassium level, your health care provider may also check other lab tests. TREATMENT Hypokalemia can be treated with potassium supplements taken by mouth or adjustments in your current medicines. If your potassium level is very low, you may need to get potassium through a vein (IV) and be monitored in the hospital. A diet high in potassium is also helpful. Foods high in potassium are:  Nuts, such as peanuts and pistachios.  Seeds, such  as sunflower seeds and pumpkin seeds.  Peas, lentils, and lima beans.  Whole grain and bran cereals and breads.  Fresh fruit and vegetables, such as apricots, avocado, bananas, cantaloupe, kiwi, oranges, tomatoes, asparagus, and potatoes.  Orange and tomato juices.  Red meats.  Fruit yogurt. HOME CARE INSTRUCTIONS  Take all medicines as prescribed by your health care provider.  Maintain a healthy diet by including nutritious food, such as fruits, vegetables, nuts, whole grains, and lean meats.  If you are taking a laxative, be sure to follow the directions on the label. SEEK MEDICAL CARE IF:  Your weakness gets worse.  You feel your heart pounding or racing.  You are vomiting or having diarrhea.  You are diabetic and having trouble keeping your blood glucose in the normal range. SEEK IMMEDIATE MEDICAL CARE IF:  You have chest pain, shortness of breath, or dizziness.  You are  vomiting or having diarrhea for more than 2 days.  You faint. MAKE SURE YOU:   Understand these instructions.  Will watch your condition.  Will get help right away if you are not doing well or get worse. Document Released: 08/22/2005 Document Revised: 06/12/2013 Document Reviewed: 02/22/2013 [facility] Patient Information 2015 [place]. This information is not intended to replace advice given to you by your health care provider. Make sure you discuss any questions you have with your health care provider.

## 2015-03-20 NOTE — ED Notes (Signed)
Patient transported to X-ray 

## 2018-02-13 ENCOUNTER — Encounter (HOSPITAL_COMMUNITY): Payer: Self-pay

## 2018-02-13 ENCOUNTER — Emergency Department (HOSPITAL_COMMUNITY): Payer: 59

## 2018-02-13 ENCOUNTER — Emergency Department (HOSPITAL_COMMUNITY)
Admission: EM | Admit: 2018-02-13 | Discharge: 2018-02-13 | Disposition: A | Discharge status: Home / Self Care | Payer: 59 | Attending: Emergency Medicine | Admitting: Emergency Medicine

## 2018-02-13 DIAGNOSIS — Z23 Encounter for immunization: Secondary | ICD-10-CM | POA: Insufficient documentation

## 2018-02-13 DIAGNOSIS — Y998 Other external cause status: Secondary | ICD-10-CM | POA: Diagnosis not present

## 2018-02-13 DIAGNOSIS — S3993XA Unspecified injury of pelvis, initial encounter: Secondary | ICD-10-CM | POA: Diagnosis not present

## 2018-02-13 DIAGNOSIS — Y92410 Unspecified street and highway as the place of occurrence of the external cause: Secondary | ICD-10-CM | POA: Insufficient documentation

## 2018-02-13 DIAGNOSIS — Y939 Activity, unspecified: Secondary | ICD-10-CM | POA: Diagnosis not present

## 2018-02-13 DIAGNOSIS — T148XXA Other injury of unspecified body region, initial encounter: Secondary | ICD-10-CM | POA: Insufficient documentation

## 2018-02-13 DIAGNOSIS — R1084 Generalized abdominal pain: Secondary | ICD-10-CM | POA: Insufficient documentation

## 2018-02-13 DIAGNOSIS — R109 Unspecified abdominal pain: Secondary | ICD-10-CM

## 2018-02-13 DIAGNOSIS — S80211A Abrasion, right knee, initial encounter: Secondary | ICD-10-CM | POA: Diagnosis not present

## 2018-02-13 DIAGNOSIS — S80212A Abrasion, left knee, initial encounter: Secondary | ICD-10-CM | POA: Diagnosis not present

## 2018-02-13 DIAGNOSIS — T07XXXA Unspecified multiple injuries, initial encounter: Secondary | ICD-10-CM

## 2018-02-13 DIAGNOSIS — S40212A Abrasion of left shoulder, initial encounter: Secondary | ICD-10-CM | POA: Diagnosis not present

## 2018-02-13 LAB — URINALYSIS, ROUTINE W REFLEX MICROSCOPIC
BACTERIA UA: NONE SEEN
Bilirubin Urine: NEGATIVE
Glucose, UA: NEGATIVE mg/dL
Hgb urine dipstick: NEGATIVE
Ketones, ur: NEGATIVE mg/dL
Nitrite: NEGATIVE
PROTEIN: NEGATIVE mg/dL
SPECIFIC GRAVITY, URINE: 1.016 (ref 1.005–1.030)
pH: 5 (ref 5.0–8.0)

## 2018-02-13 LAB — CBC WITH DIFFERENTIAL/PLATELET
BASOS ABS: 0 10*3/uL (ref 0.0–0.1)
BASOS PCT: 0 %
EOS ABS: 0.1 10*3/uL (ref 0.0–0.7)
Eosinophils Relative: 2 %
HCT: 37.1 % — ABNORMAL LOW (ref 39.0–52.0)
HEMOGLOBIN: 13.2 g/dL (ref 13.0–17.0)
Lymphocytes Relative: 43 %
Lymphs Abs: 2.7 10*3/uL (ref 0.7–4.0)
MCH: 27.8 pg (ref 26.0–34.0)
MCHC: 35.6 g/dL (ref 30.0–36.0)
MCV: 78.1 fL (ref 78.0–100.0)
Monocytes Absolute: 0.6 10*3/uL (ref 0.1–1.0)
Monocytes Relative: 9 %
NEUTROS PCT: 46 %
Neutro Abs: 2.9 10*3/uL (ref 1.7–7.7)
Platelets: 285 10*3/uL (ref 150–400)
RBC: 4.75 MIL/uL (ref 4.22–5.81)
RDW: 13.5 % (ref 11.5–15.5)
WBC: 6.3 10*3/uL (ref 4.0–10.5)

## 2018-02-13 LAB — COMPREHENSIVE METABOLIC PANEL
ALBUMIN: 4.1 g/dL (ref 3.5–5.0)
ALK PHOS: 34 U/L — AB (ref 38–126)
ALT: 40 U/L (ref 17–63)
AST: 31 U/L (ref 15–41)
Anion gap: 9 (ref 5–15)
BUN: 14 mg/dL (ref 6–20)
CALCIUM: 9 mg/dL (ref 8.9–10.3)
CHLORIDE: 105 mmol/L (ref 101–111)
CO2: 26 mmol/L (ref 22–32)
CREATININE: 1.08 mg/dL (ref 0.61–1.24)
GFR calc Af Amer: >60 mL/min (ref >60)
GFR calc non Af Amer: >60 mL/min (ref >60)
GLUCOSE: 146 mg/dL — AB (ref 65–99)
Potassium: 3.6 mmol/L (ref 3.5–5.1)
SODIUM: 140 mmol/L (ref 135–145)
Total Bilirubin: 0.5 mg/dL (ref 0.3–1.2)
Total Protein: 7.5 g/dL (ref 6.5–8.1)

## 2018-02-13 MED ORDER — BACITRACIN ZINC 500 UNIT/GM EX OINT: 1.0000 "application " | TOPICAL_OINTMENT | Freq: Two times a day (BID) | CUTANEOUS | 0 refills | Status: DC

## 2018-02-13 MED ORDER — ONDANSETRON HCL 4 MG/2ML IJ SOLN
4.0000 mg | Freq: Once | INTRAMUSCULAR | Status: AC
  Administered 2018-02-13: 4 mg via INTRAVENOUS
  Filled 2018-02-13: qty 2

## 2018-02-13 MED ORDER — CYCLOBENZAPRINE HCL 5 MG PO TABS: 5.0000 mg | ORAL_TABLET | Freq: Two times a day (BID) | ORAL | 0 refills | Status: DC | PRN

## 2018-02-13 MED ORDER — TETANUS-DIPHTH-ACELL PERTUSSIS 5-2.5-18.5 LF-MCG/0.5 IM SUSP
0.5000 mL | Freq: Once | INTRAMUSCULAR | Status: AC
  Administered 2018-02-13: 0.5 mL via INTRAMUSCULAR
  Filled 2018-02-13: qty 0.5

## 2018-02-13 MED ORDER — MORPHINE SULFATE (PF) 4 MG/ML IV SOLN
4.0000 mg | Freq: Once | INTRAVENOUS | Status: AC
  Administered 2018-02-13: 4 mg via INTRAVENOUS
  Filled 2018-02-13: qty 1

## 2018-02-13 MED ORDER — CYCLOBENZAPRINE HCL 10 MG PO TABS
5.0000 mg | ORAL_TABLET | Freq: Once | ORAL | Status: AC
  Administered 2018-02-13: 5 mg via ORAL
  Filled 2018-02-13: qty 1

## 2018-02-13 MED ORDER — KETOROLAC TROMETHAMINE 30 MG/ML IJ SOLN
30.0000 mg | Freq: Once | INTRAMUSCULAR | Status: AC
  Administered 2018-02-13: 30 mg via INTRAMUSCULAR
  Filled 2018-02-13: qty 1

## 2018-02-13 MED ORDER — NAPROXEN 500 MG PO TABS: 500.0000 mg | ORAL_TABLET | Freq: Two times a day (BID) | ORAL | 0 refills | Status: DC
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
## 2018-02-13 NOTE — ED Notes (Signed)
Pt. Ambulated with no assist. Pt. Ambulated around in the room and back to bed. Pt. Stated that his right side was sore . Nurse aware.
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
## 2018-03-30 ENCOUNTER — Emergency Department (HOSPITAL_COMMUNITY)
Admission: EM | Admit: 2018-03-30 | Discharge: 2018-03-30 | Disposition: A | Discharge status: Home / Self Care | Payer: 59 | Attending: Emergency Medicine | Admitting: Emergency Medicine

## 2018-03-30 ENCOUNTER — Other Ambulatory Visit: Payer: Self-pay

## 2018-03-30 ENCOUNTER — Encounter (HOSPITAL_COMMUNITY): Payer: Self-pay | Admitting: Emergency Medicine

## 2018-03-30 DIAGNOSIS — M5441 Lumbago with sciatica, right side: Secondary | ICD-10-CM | POA: Insufficient documentation

## 2018-03-30 DIAGNOSIS — M5431 Sciatica, right side: Secondary | ICD-10-CM

## 2018-03-30 DIAGNOSIS — M545 Low back pain: Secondary | ICD-10-CM | POA: Diagnosis present

## 2018-03-30 DIAGNOSIS — G8929 Other chronic pain: Secondary | ICD-10-CM | POA: Diagnosis not present

## 2018-03-30 MED ORDER — PREDNISONE 10 MG (21) PO TBPK: ORAL_TABLET | Freq: Every day | ORAL | 0 refills | Status: AC

## 2018-03-30 MED ORDER — KETOROLAC TROMETHAMINE 60 MG/2ML IM SOLN
30.0000 mg | Freq: Once | INTRAMUSCULAR | Status: AC
  Administered 2018-03-30: 30 mg via INTRAMUSCULAR
  Filled 2018-03-30: qty 2

## 2018-03-30 MED ORDER — METHOCARBAMOL 500 MG PO TABS: 500.0000 mg | ORAL_TABLET | ORAL | 0 refills | Status: AC | PRN

## 2018-03-30 MED ORDER — NAPROXEN 500 MG PO TABS: 500.0000 mg | ORAL_TABLET | ORAL | 0 refills | Status: AC | PRN
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
## 2018-03-30 NOTE — Discharge Instructions (Signed)
I have provided pain medication, please take as directed. Please do not drive while taking this medication as it can make you drowsy. Please return to the ED if you experience any of the following symptoms:  You cannot control when you pee (urinate) or poop (have a bowel movement). You have weakness in any of these areas and it gets worse. Lower back. Lower belly (pelvis). Butt (buttocks). Legs. You have redness or swelling of your back. You have a burning feeling when you pee.

## 2018-03-30 NOTE — ED Notes (Signed)
PA at bedside.
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
## 2018-05-25 ENCOUNTER — Ambulatory Visit
Admission: RE | Admit: 2018-05-25 | Discharge: 2018-05-25 | Disposition: A | Discharge status: Home / Self Care | Payer: 59 | Source: Ambulatory Visit | Attending: Internal Medicine | Admitting: Internal Medicine

## 2018-05-25 ENCOUNTER — Other Ambulatory Visit: Payer: Self-pay | Admitting: Internal Medicine

## 2018-05-25 DIAGNOSIS — Z0000 Encounter for general adult medical examination without abnormal findings: Secondary | ICD-10-CM | POA: Diagnosis not present

## 2018-05-25 DIAGNOSIS — R05 Cough: Secondary | ICD-10-CM

## 2018-05-25 DIAGNOSIS — R059 Cough, unspecified: Secondary | ICD-10-CM

## 2018-05-25 DIAGNOSIS — Z136 Encounter for screening for cardiovascular disorders: Secondary | ICD-10-CM | POA: Diagnosis not present

## 2018-07-31 DIAGNOSIS — K5730 Diverticulosis of large intestine without perforation or abscess without bleeding: Secondary | ICD-10-CM | POA: Diagnosis not present

## 2018-07-31 DIAGNOSIS — K640 First degree hemorrhoids: Secondary | ICD-10-CM | POA: Diagnosis not present

## 2018-07-31 DIAGNOSIS — Z1211 Encounter for screening for malignant neoplasm of colon: Secondary | ICD-10-CM | POA: Diagnosis not present

## 2019-05-30 ENCOUNTER — Other Ambulatory Visit: Payer: Self-pay

## 2019-05-30 ENCOUNTER — Ambulatory Visit: Payer: 59 | Admitting: Podiatry

## 2019-05-30 VITALS — Temp 98.2°F

## 2019-05-30 DIAGNOSIS — B353 Tinea pedis: Secondary | ICD-10-CM

## 2019-05-30 DIAGNOSIS — B351 Tinea unguium: Secondary | ICD-10-CM

## 2019-05-30 MED ORDER — KETOCONAZOLE 2 % EX CREA: TOPICAL_CREAM | CUTANEOUS | 0 refills | Status: AC

## 2019-05-30 NOTE — Progress Notes (Signed)
  Subjective:  Patient ID: David Yates, male    DOB: 1965/10/30,  MRN: [id number]  Chief Complaint  Patient presents with  . Callouses    Left sub 4th painful callous, denies any drainage, no known injuries, 1 week duration.    53 y.o. male presents with the above complaint. Hx as above.  Review of Systems: Negative except as noted in the HPI. Denies N/V/F/Ch.  No past medical history on file.  Current Outpatient Medications:  .  naproxen (NAPROSYN) 500 MG tablet, Take 1 tablet (500 mg total) by mouth as needed., Disp: 30 tablet, Rfl: 0 .  predniSONE (STERAPRED UNI-PAK 21 TAB) 10 MG (21) TBPK tablet, Take by mouth daily. Take 6 tabs by mouth daily  for 2 days, then 5 tabs for 2 days, then 4 tabs for 2 days, then 3 tabs for 2 days, 2 tabs for 2 days, then 1 tab by mouth daily for 2 days, Disp: 42 tablet, Rfl: 0  Social History   Tobacco Use  Smoking Status Never Smoker  Smokeless Tobacco Never Used    No Known Allergies Objective:   Vitals:   05/30/19 1010  Temp: 98.2 F (36.8 C)   There is no height or weight on file to calculate BMI. Constitutional Well developed. Well nourished.  Vascular Dorsalis pedis pulses palpable bilaterally. Posterior tibial pulses palpable bilaterally. Capillary refill normal to all digits.  No cyanosis or clubbing noted. Pedal hair growth normal.  Neurologic Normal speech. Oriented to person, place, and time. Epicritic sensation to light touch grossly present bilaterally.  Dermatologic Nails thickened dystrophic Skin xerosis with scaling, slight fissure left submet 4, interdigital maceration noted.  Orthopedic: Normal joint ROM without pain or crepitus bilaterally. No visible deformities. No bony tenderness.   Radiographs: None Assessment:   1. Tinea pedis of both feet   2. Onychomycosis    Plan:  Patient was evaluated and treated and all questions answered.  Tinea Pedis, Skin Fissure -Educated on etiology -Rx ketoconazole,  educated on use. -Educated on pedal hygiene and sterilizing shoes to rid them of fungus. -Keep skin moisturized -F/u should pain persist.  No follow-ups on file.
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
## 2020-02-28 IMAGING — CR DG CHEST 2V
2 series · 2 of 2 positions shown · non-contrast
Comparison: 04/17/2007

CLINICAL DATA: Dry cough.

EXAM:
CHEST - 2 VIEW

[w chest pa]
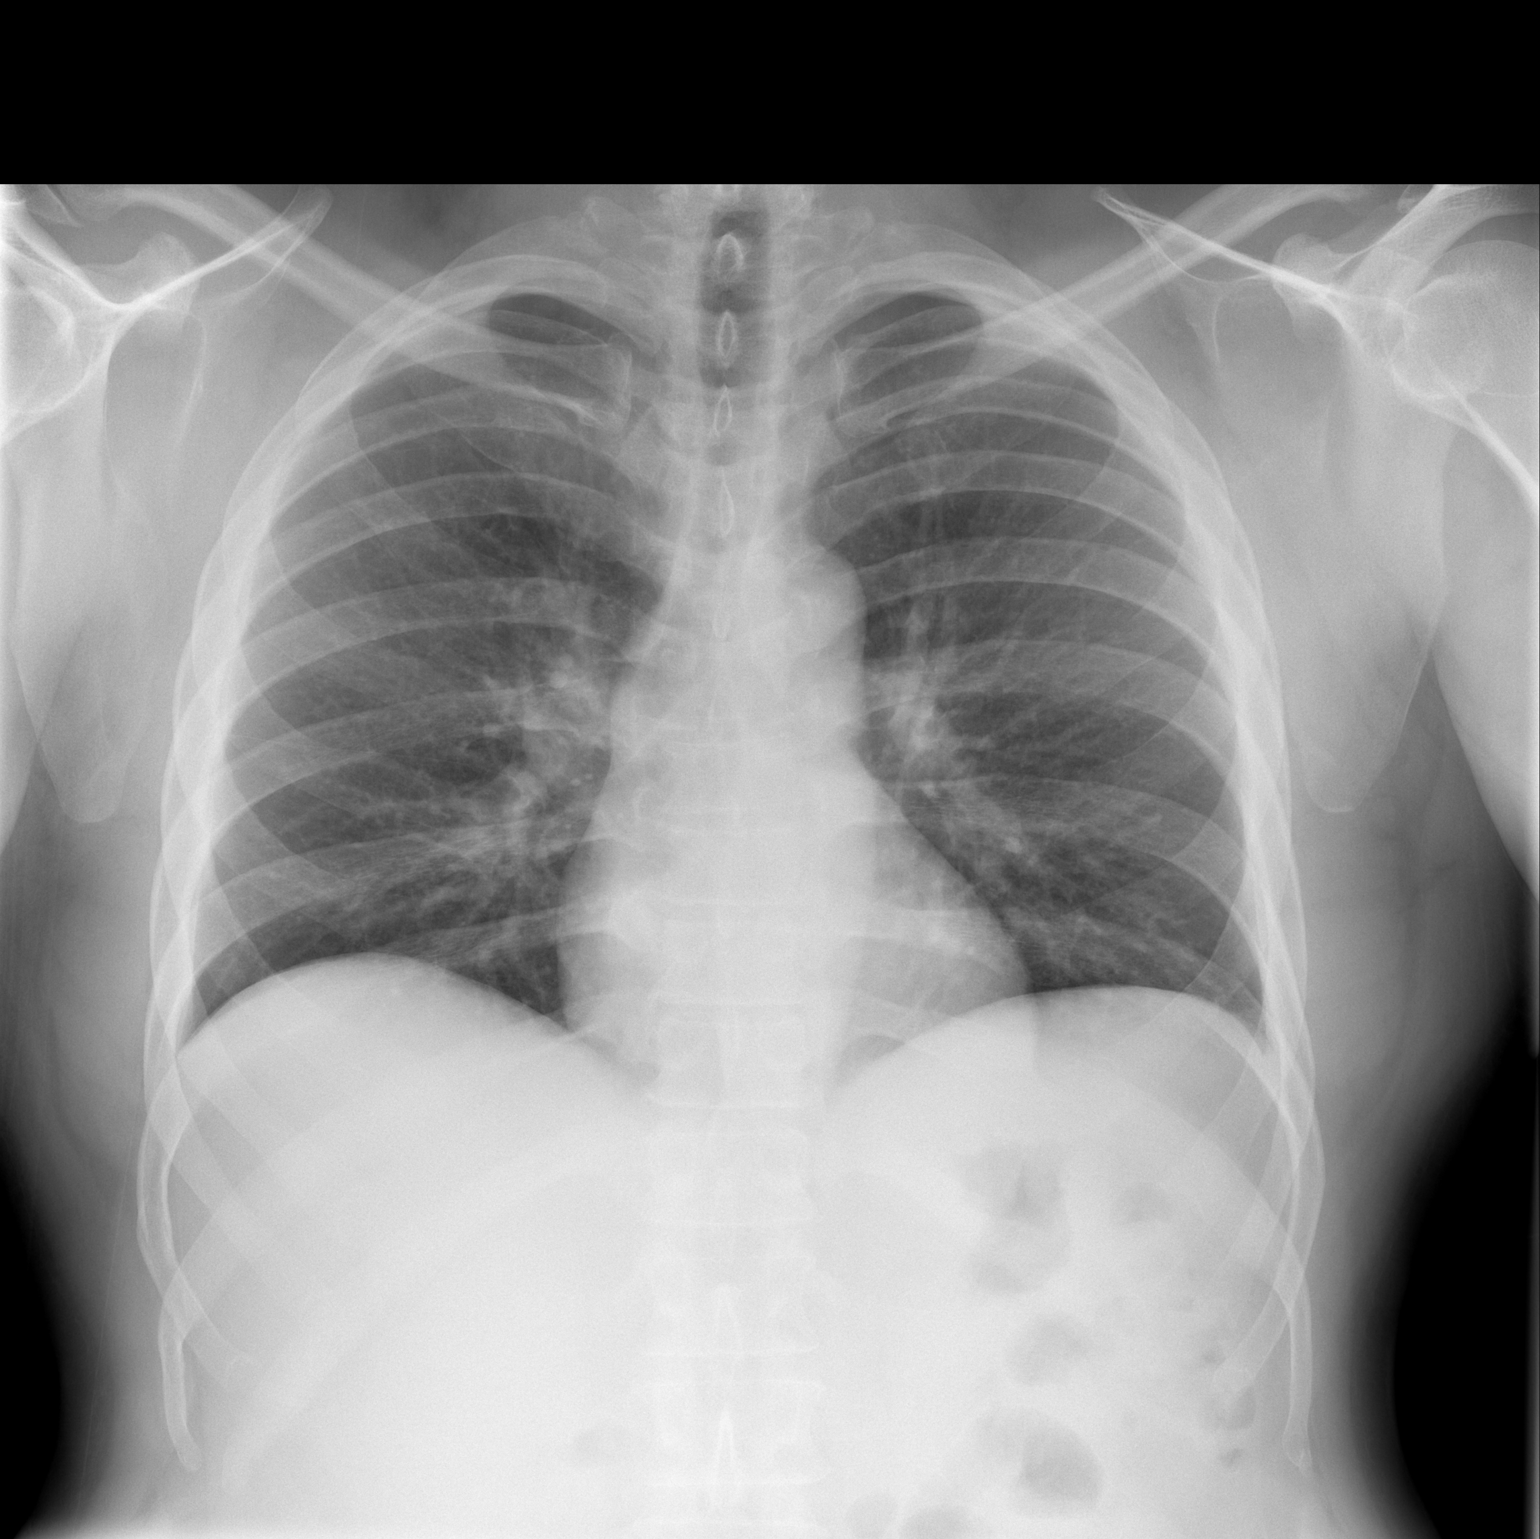

[w chest lat]
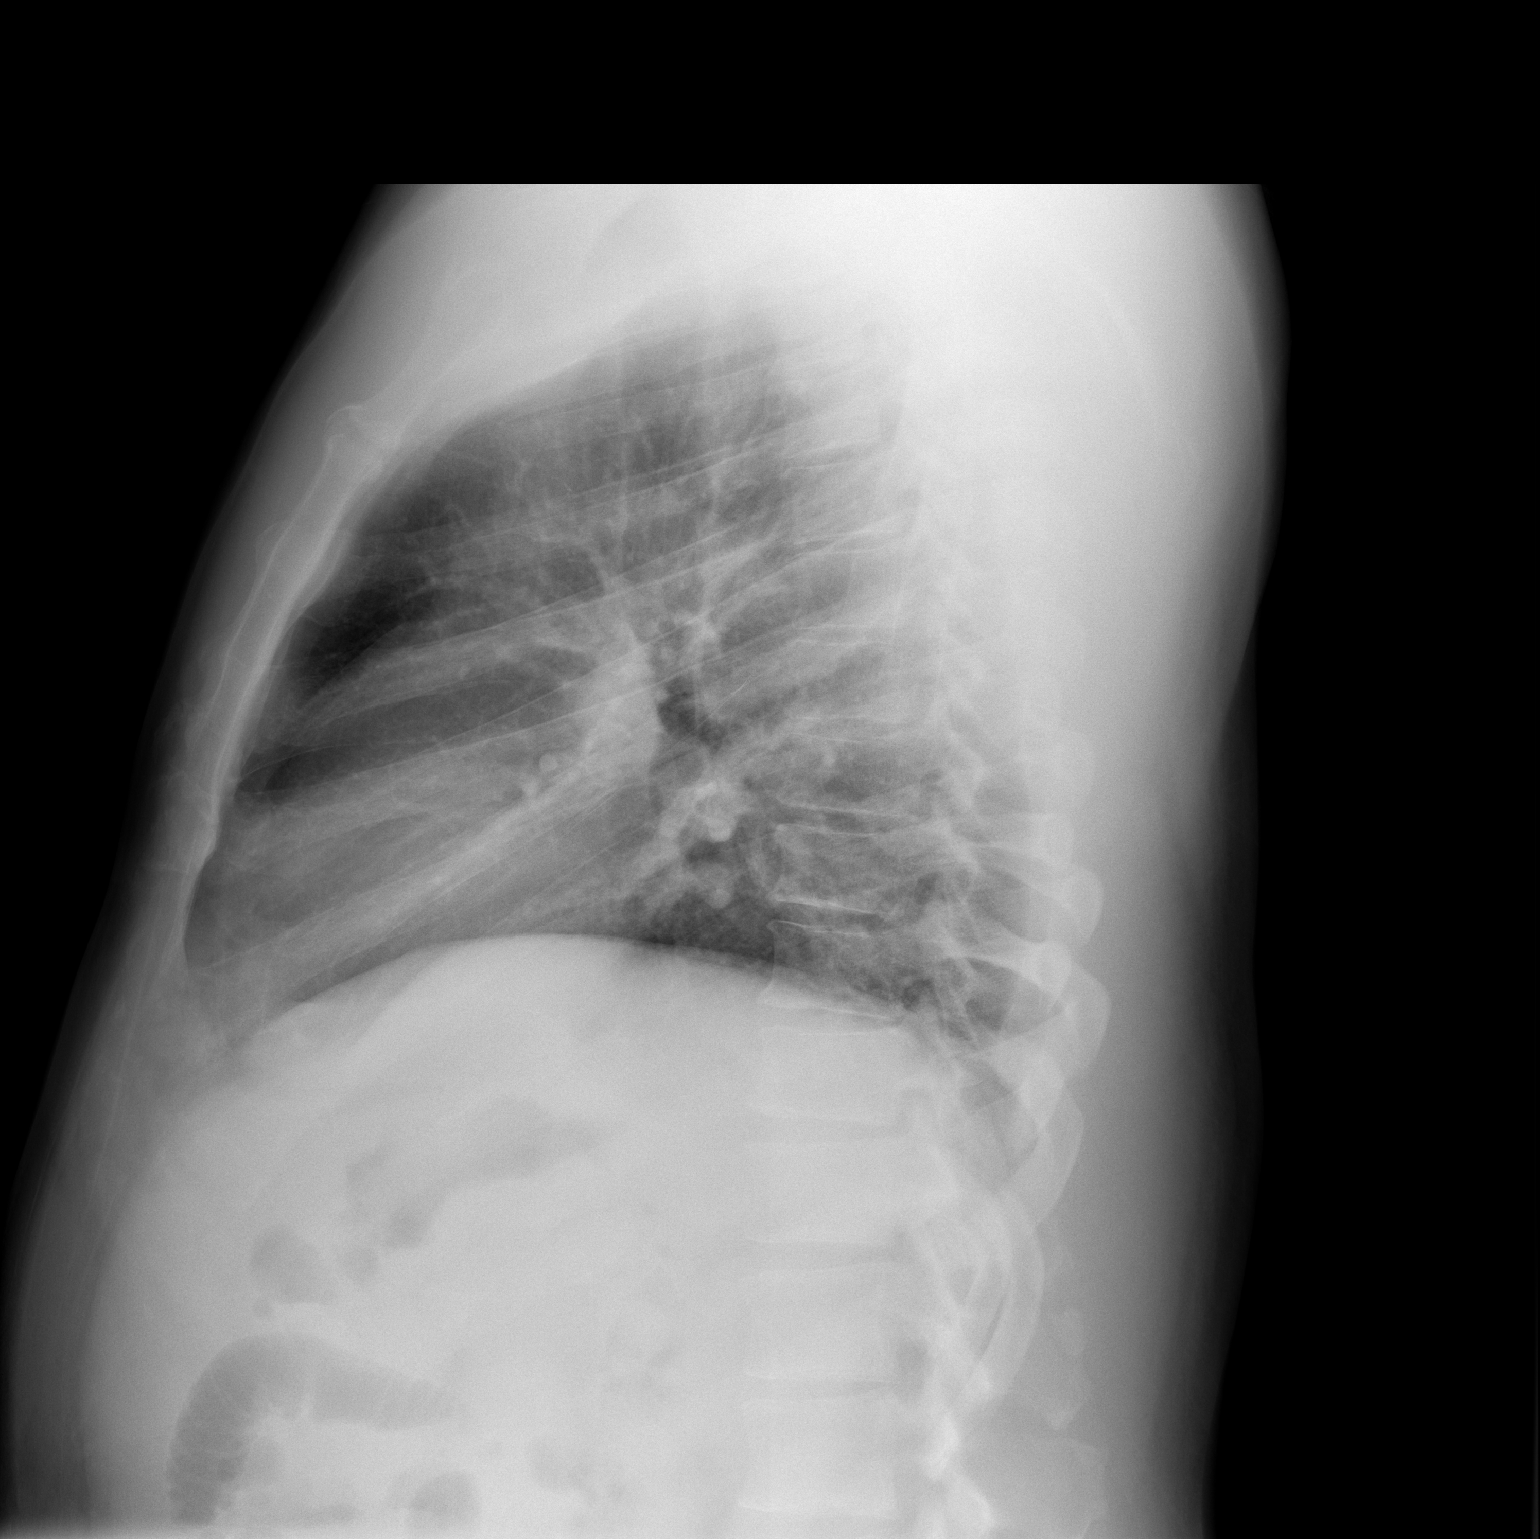

[2 of 2 positions shown; findings below may reference images not displayed]

FINDINGS: The heart size and mediastinal contours are within normal limits.
Both lungs are clear. The visualized skeletal structures are
unremarkable.
IMPRESSION: No active cardiopulmonary disease.

## 2020-09-12 ENCOUNTER — Ambulatory Visit: Payer: 59

## 2020-12-02 ENCOUNTER — Other Ambulatory Visit (HOSPITAL_COMMUNITY): Payer: Self-pay | Admitting: Internal Medicine

## 2020-12-02 ENCOUNTER — Ambulatory Visit: Payer: 59 | Attending: Internal Medicine

## 2020-12-02 DIAGNOSIS — Z23 Encounter for immunization: Secondary | ICD-10-CM

## 2020-12-02 NOTE — Progress Notes (Signed)
   Covid-19 Vaccination Clinic  Name:  David Yates    MRN: [id number] DOB: 06-09-1966  12/02/2020  David Yates was observed post Covid-19 immunization for 15 minutes without incident. He was provided with Vaccine Information Sheet and instruction to access the V-Safe system.   David Yates was instructed to call 911 with any severe reactions post vaccine: Marland Kitchen Difficulty breathing  . Swelling of face and throat  . A fast heartbeat  . A bad rash all over body  . Dizziness and weakness   Immunizations Administered    Name Date Dose VIS Date Route   PFIZER Comrnaty(Gray TOP) Covid-19 Vaccine 12/02/2020 12:19 PM 0.3 mL 08/13/2020 Intramuscular   Manufacturer: [employer], [employer]   Lot: [id number]   NDC: [phone]

## 2022-04-22 DIAGNOSIS — E1165 Type 2 diabetes mellitus with hyperglycemia: Secondary | ICD-10-CM | POA: Diagnosis not present

## 2022-04-22 DIAGNOSIS — N529 Male erectile dysfunction, unspecified: Secondary | ICD-10-CM | POA: Diagnosis not present

## 2022-04-22 DIAGNOSIS — E785 Hyperlipidemia, unspecified: Secondary | ICD-10-CM | POA: Diagnosis not present

## 2022-05-16 DIAGNOSIS — N529 Male erectile dysfunction, unspecified: Secondary | ICD-10-CM | POA: Diagnosis not present

## 2022-07-19 DIAGNOSIS — E1169 Type 2 diabetes mellitus with other specified complication: Secondary | ICD-10-CM | POA: Diagnosis not present

## 2022-07-19 DIAGNOSIS — E785 Hyperlipidemia, unspecified: Secondary | ICD-10-CM | POA: Diagnosis not present

## 2022-07-19 DIAGNOSIS — H6121 Impacted cerumen, right ear: Secondary | ICD-10-CM | POA: Diagnosis not present

## 2022-08-01 ENCOUNTER — Other Ambulatory Visit: Payer: Self-pay

## 2022-08-01 ENCOUNTER — Emergency Department (HOSPITAL_BASED_OUTPATIENT_CLINIC_OR_DEPARTMENT_OTHER)
Admission: EM | Admit: 2022-08-01 | Discharge: 2022-08-01 | Disposition: A | Discharge status: Home / Self Care | Payer: BC Managed Care – PPO | Attending: Emergency Medicine | Admitting: Emergency Medicine

## 2022-08-01 ENCOUNTER — Encounter (HOSPITAL_BASED_OUTPATIENT_CLINIC_OR_DEPARTMENT_OTHER): Payer: Self-pay | Admitting: Urology

## 2022-08-01 DIAGNOSIS — H6121 Impacted cerumen, right ear: Secondary | ICD-10-CM | POA: Diagnosis not present

## 2022-08-01 MED ORDER — CARBAMIDE PEROXIDE 6.5 % OT SOLN: 5.0000 [drp] | Freq: Two times a day (BID) | OTIC | 0 refills | Status: AC

## 2022-08-01 NOTE — ED Notes (Signed)
Patient ambulatory to to the bathroom

## 2022-08-01 NOTE — Discharge Instructions (Signed)
You were evaluated in the emergency department for ear fullness.  On exam, excessive amount of very dry earwax seen in the right ear.  As a reminder, please refrain from using any Q-tips in your ear, as these can cause many complications.  Recommend continued use of 1/2 part hydrogen peroxide and 1/2 part warm water eardrops for 10 to 15 minutes at a time, followed by use of warm water flush.  Further instructions provided as discussed.  ENT contact information has been provided for you.  Please call first thing tomorrow morning to schedule a follow-up within the next 2 to 3 days for reevaluation and continued medical management.  Return to the ED for any new or worsening symptoms as discussed.

## 2022-08-01 NOTE — ED Triage Notes (Signed)
Pt states can't hear out of right ear x 2 weeks States feels like wax is built up on ear drum

## 2022-08-01 NOTE — ED Provider Notes (Signed)
[facility] EMERGENCY DEPARTMENT Provider Note   CSN: [id number] Arrival date & time: 08/01/22  1412     History  Chief Complaint  Patient presents with   Ear Fullness    David Yates is a 56 y.o. male presenting to the ED with chief complaint of right ear fullness.  States this was felt full for the last 2 weeks.  Saw PCP 3 to 4 days ago, who tried flushing the wax in the office.  Patient states after multiple times, was told the earwax was significantly dry and needed to attempt eardrops at home.  Patient states he placed 5 eardrops and the right ear, waited 3 to 5 minutes, and then tried to splash water into the ear.  States this was unsuccessful.  Denies fever, drainage, or bleeding from the ear.  Denies hearing loss, though states the sensation of fullness has become quite frustrating.  Denies fever, neck stiffness, or difficulty swallowing.  Denies congestion, postnasal drip, cough, or URI symptoms.  The history is provided by the patient and medical records.  Ear Fullness     Home Medications Prior to Admission medications   Medication Sig Start Date End Date Taking? Authorizing Provider  carbamide peroxide (DEBROX) 6.5 % OTIC solution Place 5 drops into the right ear 2 (two) times daily. 08/01/22  Yes Kathie Dike M, PA-C  ketoconazole (NIZORAL) 2 % cream Apply 1 fingertip amount to each foot twice daily; do not apply between toes 05/30/19   Park Liter, DPM  naproxen (NAPROSYN) 500 MG tablet Take 1 tablet (500 mg total) by mouth as needed. 03/30/18   Claude Manges, PA-C  predniSONE (STERAPRED UNI-PAK 21 TAB) 10 MG (21) TBPK tablet Take by mouth daily. Take 6 tabs by mouth daily  for 2 days, then 5 tabs for 2 days, then 4 tabs for 2 days, then 3 tabs for 2 days, 2 tabs for 2 days, then 1 tab by mouth daily for 2 days 03/30/18   Claude Manges, PA-C      Allergies    Patient has no known allergies.    Review of Systems   Review of Systems  HENT:          Ear fullness, wax    Physical Exam Updated Vital Signs BP (!) 142/91 (BP Location: Left Arm)   Pulse 74   Temp 98 F (36.7 C)   Resp 17   Ht 6' (1.829 m)   Wt 101.6 kg   SpO2 100%   BMI 30.38 kg/m  Physical Exam Vitals and nursing note reviewed.  Constitutional:      General: He is not in acute distress.    Appearance: He is well-developed. He is not ill-appearing or toxic-appearing.  HENT:     Head: Normocephalic and atraumatic.     Right Ear: Tympanic membrane and ear canal normal. No laceration, drainage, swelling or tenderness. There is impacted cerumen. No foreign body. No mastoid tenderness.     Left Ear: Tympanic membrane, ear canal and external ear normal. No mastoid tenderness.     Ears:     Comments: Hearing appears grossly intact.      Nose: Nose normal.     Mouth/Throat:     Mouth: Mucous membranes are moist.     Pharynx: Oropharynx is clear. No oropharyngeal exudate or posterior oropharyngeal erythema.  Eyes:     General: No scleral icterus.    Conjunctiva/sclera: Conjunctivae normal.  Cardiovascular:     Rate and  Rhythm: Normal rate and regular rhythm.     Heart sounds: No murmur heard. Pulmonary:     Effort: Pulmonary effort is normal. No respiratory distress.     Breath sounds: Normal breath sounds.  Abdominal:     Palpations: Abdomen is soft.     Tenderness: There is no abdominal tenderness.  Musculoskeletal:        General: No swelling.     Cervical back: Neck supple. No rigidity.  Skin:    General: Skin is warm and dry.     Capillary Refill: Capillary refill takes less than 2 seconds.     Coloration: Skin is not jaundiced or pale.  Neurological:     Mental Status: He is alert.  Psychiatric:        Mood and Affect: Mood normal.     ED Results / Procedures / Treatments   Labs (all labs ordered are listed, but only abnormal results are displayed) Labs Reviewed - No data to display  EKG None  Radiology No results  found.  Procedures Procedures    Medications Ordered in ED Medications - No data to display  ED Course/ Medical Decision Making/ A&P                           Medical Decision Making  56 y.o. male presents to the ED for concern of Ear Fullness   This involves an extensive number of treatment options, and is a complaint that carries with it a high risk of complications and morbidity.  The emergent differential diagnosis prior to evaluation includes, but is not limited to: ear fullness, otitis externa, mastoiditis  This is not an exhaustive differential.   Past Medical History / Co-morbidities / Social History: No PMHx. Social Determinants of Health include: None  Additional History:  None  Lab Tests: None  Imaging Studies: None  ED Course: Pt well-appearing on exam.  Presenting to the ED with chief complaint of ear fullness to the right ear over the last 2 weeks.  Saw PCP per patient 3 to 4 days ago.  At that time, tried to irrigate/provide wax removal.  This was unsuccessful due to the significant dryness of the earwax per patient.   On exam, earwax of the ear also appears significantly dry.  Without evidence of otitis externa, follow-up drainage or hemorrhage from the ear, or mastoiditis.  Low suspicion for otitis media or URI at this time. Afebrile, hemodynamically stable.  Patient reports using a 50-50 mix of hydrogen peroxide and warm water drops for 5 minutes at a time then trying to splash water in the ear at home without much success.  Considering attempting earwax removal mechanically, however earwax appears very close/stuck to the surface of the TM.  Given close proximity and hemodynamic stability, believe manual removal would pose significant risk of damage to the eardrum.  Recommend continued use of the 50-50 mix with warm water flush/irrigation and provided instruction on improved technique, with close ENT follow-up.  Syringe provided.  Debrox sent to pharmacy as  possible alternative to 50-50 mix.  Patient reports satisfaction with today's encounter.  Patient in NAD and in good condition at time of discharge.  Disposition: After consideration the patient's encounter today, I do not feel today's workup suggests an emergent condition requiring admission or immediate intervention beyond what has been performed at this time.  Safe for discharge; instructed to return immediately for worsening symptoms, change in symptoms or any other  concerns.  I have reviewed the patients home medicines and have made adjustments as needed.  Discussed course of treatment with the patient, whom demonstrated understanding.  Patient in agreement and has no further questions.    I discussed this case with my attending physician Dr. Lockie Mola, who agreed with the proposed treatment course and cosigned this note.  Attending physician stated agreement with plan or made changes to plan which were implemented.     This chart was dictated using voice recognition software.  Despite best efforts to proofread, errors can occur which can change the documentation meaning.         Final Clinical Impression(s) / ED Diagnoses Final diagnoses:  Excessive ear wax, right    Rx / DC Orders ED Discharge Orders          Ordered    carbamide peroxide (DEBROX) 6.5 % OTIC solution  2 times daily        08/01/22 1746              Cecil Cobbs, PA-C 08/01/22 1747    Virgina Norfolk, DO 08/01/22 2252

## 2023-01-17 DIAGNOSIS — Z6835 Body mass index (BMI) 35.0-35.9, adult: Secondary | ICD-10-CM | POA: Diagnosis not present

## 2023-01-17 DIAGNOSIS — Z8249 Family history of ischemic heart disease and other diseases of the circulatory system: Secondary | ICD-10-CM | POA: Diagnosis not present

## 2023-01-17 DIAGNOSIS — E785 Hyperlipidemia, unspecified: Secondary | ICD-10-CM | POA: Diagnosis not present

## 2023-01-17 DIAGNOSIS — Z7984 Long term (current) use of oral hypoglycemic drugs: Secondary | ICD-10-CM | POA: Diagnosis not present

## 2023-01-17 DIAGNOSIS — E119 Type 2 diabetes mellitus without complications: Secondary | ICD-10-CM | POA: Diagnosis not present

## 2023-01-17 DIAGNOSIS — Z833 Family history of diabetes mellitus: Secondary | ICD-10-CM | POA: Diagnosis not present

## 2023-01-17 DIAGNOSIS — F1021 Alcohol dependence, in remission: Secondary | ICD-10-CM | POA: Diagnosis not present

## 2023-01-17 DIAGNOSIS — R030 Elevated blood-pressure reading, without diagnosis of hypertension: Secondary | ICD-10-CM | POA: Diagnosis not present

## 2023-01-17 DIAGNOSIS — Z811 Family history of alcohol abuse and dependence: Secondary | ICD-10-CM | POA: Diagnosis not present

## 2023-01-18 ENCOUNTER — Telehealth (HOSPITAL_COMMUNITY): Payer: Self-pay | Admitting: Emergency Medicine

## 2023-01-18 ENCOUNTER — Other Ambulatory Visit: Payer: Self-pay

## 2023-01-18 ENCOUNTER — Encounter (HOSPITAL_COMMUNITY): Payer: Self-pay | Admitting: Emergency Medicine

## 2023-01-18 ENCOUNTER — Ambulatory Visit (HOSPITAL_COMMUNITY)
Admission: EM | Admit: 2023-01-18 | Discharge: 2023-01-18 | Disposition: A | Discharge status: Home / Self Care | Payer: 59

## 2023-01-18 DIAGNOSIS — H6001 Abscess of right external ear: Secondary | ICD-10-CM | POA: Diagnosis not present

## 2023-01-18 DIAGNOSIS — R590 Localized enlarged lymph nodes: Secondary | ICD-10-CM

## 2023-01-18 HISTORY — DX: Type 2 diabetes mellitus without complications: E11.9

## 2023-01-18 HISTORY — DX: Pure hypercholesterolemia, unspecified: E78.00

## 2023-01-18 MED ORDER — AMOXICILLIN-POT CLAVULANATE 875-125 MG PO TABS: 1.0000 | ORAL_TABLET | Freq: Two times a day (BID) | ORAL | 0 refills | Status: AC

## 2023-01-18 MED ORDER — AMOXICILLIN-POT CLAVULANATE 875-125 MG PO TABS: 1.0000 | ORAL_TABLET | Freq: Two times a day (BID) | ORAL | 0 refills | Status: DC

## 2023-01-18 NOTE — ED Notes (Signed)
Questions at discharge, patient thought he was getting a specialist name to see.

## 2023-01-18 NOTE — ED Notes (Signed)
Reviewed work note and notified provider of questions

## 2023-01-18 NOTE — Telephone Encounter (Signed)
Patient requested [facility] .

## 2023-01-18 NOTE — ED Provider Notes (Signed)
[facility]    CSN: [id number] Arrival date & time: 01/18/23  1041      History   Chief Complaint Chief Complaint  Patient presents with   Otalgia    HPI David Yates is a 57 y.o. male.   Patient presents to urgent care for evaluation of right external ear tragus swelling that he first noticed yesterday but has worsened over the last 24 hours.  He states the area of swelling is tender to touch but he has not noticed any warmth, redness, or drainage from the area of swelling.  Reports posterior auricular pain that he describes as sharp and shooting into the neck.  No recent antibiotic/steroid use.  He is a type II diabetic and has been taking his medications as prescribed with normal blood sugars.  Denies drainage from the inner ear, decreased hearing from the right ear, sore throat, nausea, vomiting, rash, abdominal pain, headache, dizziness, tinnitus, and rhinorrhea/nasal congestion.  No recent cough or viral upper respiratory infection symptoms.  No recent trauma/injury to the right ear.  Has not attempted use of any over-the-counter medications to help with symptoms of her coming to urgent care.     Past Medical History:  Diagnosis Date   Diabetes mellitus without complication (HCC)    High cholesterol     There are no problems to display for this patient.   History reviewed. No pertinent surgical history.     Home Medications    Prior to Admission medications   Medication Sig Start Date End Date Taking? Authorizing Provider  amoxicillin-clavulanate (AUGMENTIN) 875-125 MG tablet Take 1 tablet by mouth every 12 (twelve) hours. 01/18/23  Yes Carlisle Beers, FNP  atorvastatin (LIPITOR) 10 MG tablet Take 10 mg by mouth daily. 12/22/22  Yes [provider]  JARDIANCE 25 MG TABS tablet Take 25 mg by mouth daily. 01/17/23  Yes [provider]  metFORMIN (GLUCOPHAGE) 1000 MG tablet Take 1,000 mg by mouth 2 (two) times daily. 12/30/22  Yes  [provider]  carbamide peroxide (DEBROX) 6.5 % OTIC solution Place 5 drops into the right ear 2 (two) times daily. Patient not taking: Reported on 01/18/2023 08/01/22   Cecil Cobbs, PA-C  ketoconazole (NIZORAL) 2 % cream Apply 1 fingertip amount to each foot twice daily; do not apply between toes Patient not taking: Reported on 01/18/2023 05/30/19   Park Liter, DPM  naproxen (NAPROSYN) 500 MG tablet Take 1 tablet (500 mg total) by mouth as needed. Patient not taking: Reported on 01/18/2023 03/30/18   Claude Manges, PA-C  predniSONE (STERAPRED UNI-PAK 21 TAB) 10 MG (21) TBPK tablet Take by mouth daily. Take 6 tabs by mouth daily  for 2 days, then 5 tabs for 2 days, then 4 tabs for 2 days, then 3 tabs for 2 days, 2 tabs for 2 days, then 1 tab by mouth daily for 2 days Patient not taking: Reported on 01/18/2023 03/30/18   Claude Manges, PA-C    Family History History reviewed. No pertinent family history.  Social History Social History   Tobacco Use   Smoking status: Never   Smokeless tobacco: Never  Vaping Use   Vaping Use: Never used  Substance Use Topics   Alcohol use: No   Drug use: No     Allergies   Patient has no known allergies.   Review of Systems Review of Systems Per HPI  Physical Exam Triage Vital Signs ED Triage Vitals  Enc Vitals  Group     BP 01/18/23 1139 126/80     Pulse Rate 01/18/23 1139 81     Resp 01/18/23 1139 20     Temp 01/18/23 1139 98.2 F (36.8 C)     Temp Source 01/18/23 1139 Oral     SpO2 01/18/23 1139 95 %     Weight --      Height --      Head Circumference --      Peak Flow --      Pain Score 01/18/23 1137 8     Pain Loc --      Pain Edu? --      Excl. in GC? --    No data found.  Updated Vital Signs BP 126/80 (BP Location: Right Arm) Comment (BP Location): large cuff  Pulse 81   Temp 98.2 F (36.8 C) (Oral)   Resp 20   SpO2 95%   Visual Acuity Right Eye Distance:   Left Eye Distance:   Bilateral  Distance:    Right Eye Near:   Left Eye Near:    Bilateral Near:     Physical Exam Vitals and nursing note reviewed.  Constitutional:      Appearance: He is not ill-appearing or toxic-appearing.  HENT:     Head: Normocephalic and atraumatic.     Right Ear: Hearing, tympanic membrane and ear canal normal.     Left Ear: Hearing, tympanic membrane, ear canal and external ear normal.     Ears:      Comments: Non-fluctuant cyst to the right tragus without erythema or drainage. Cyst is tender to palpation. Posterior auricular lymphadenopathy and bilateral cervical lymphadenopathy present. See image below for further details.    Nose: Nose normal.     Mouth/Throat:     Lips: Pink.     Mouth: Mucous membranes are moist. No injury.     Tongue: No lesions. Tongue does not deviate from midline.     Palate: No mass and lesions.     Pharynx: Oropharynx is clear. Uvula midline. No pharyngeal swelling, oropharyngeal exudate, posterior oropharyngeal erythema or uvula swelling.     Tonsils: No tonsillar exudate or tonsillar abscesses.  Eyes:     General: Lids are normal. Vision grossly intact. Gaze aligned appropriately.     Extraocular Movements: Extraocular movements intact.     Conjunctiva/sclera: Conjunctivae normal.  Pulmonary:     Effort: Pulmonary effort is normal.  Musculoskeletal:     Cervical back: Neck supple.  Skin:    General: Skin is warm and dry.     Capillary Refill: Capillary refill takes less than 2 seconds.     Findings: No rash.  Neurological:     General: No focal deficit present.     Mental Status: He is alert and oriented to person, place, and time. Mental status is at baseline.     Cranial Nerves: No dysarthria or facial asymmetry.  Psychiatric:        Mood and Affect: Mood normal.        Speech: Speech normal.        Behavior: Behavior normal.        Thought Content: Thought content normal.        Judgment: Judgment normal.      UC Treatments / Results   Labs (all labs ordered are listed, but only abnormal results are displayed) Labs Reviewed - No data to display  EKG   Radiology No results found.  Procedures Procedures (  including critical care time)  Medications Ordered in UC Medications - No data to display  Initial Impression / Assessment and Plan / UC Course  I have reviewed the triage vital signs and the nursing notes.  Pertinent labs & imaging results that were available during my care of the patient were reviewed by me and considered in my medical decision making (see chart for details).   1. Abscess of right tragus ear, posterior auricular lymphadenopathy Presentation consistent with acute infected abscess of tragus of right ear.  Augmentin twice daily for the next 7 days sent to pharmacy to be taken as prescribed to treat infection to the right tragus abscess.  Ibuprofen as needed for pain.  Warm compresses recommended.  Will defer I&D today as abscess is firm and non fluctuant.  Advised to return to urgent care if symptoms fail to improve in the next 24 to 48 hours with use of antibiotic or if swelling/tenderness worsen.   Discussed physical exam and available lab work findings in clinic with patient.  Counseled patient regarding appropriate use of medications and potential side effects for all medications recommended or prescribed today. Discussed red flag signs and symptoms of worsening condition,when to call the PCP office, return to urgent care, and when to seek higher level of care in the emergency department. Patient verbalizes understanding and agreement with plan. All questions answered. Patient discharged in stable condition.    Final Clinical Impressions(s) / UC Diagnoses   Final diagnoses:  Abscess of tragus of right ear  Posterior auricular lymphadenopathy     Discharge Instructions      I am concerned you may have an abscess to the tragus of your right ear.   Take Augmentin antibiotic twice daily for  the next 7 days.  You may take ibuprofen as needed for pain.  Apply warm compresses to the right ear to reduce swelling and irritation to the abscess.  If you develop fever, chills, worsening swelling, erythema, or drainage to the abscess, or any other new or worsening symptoms, please go to the nearest emergency department for reevaluation.  Otherwise, return to urgent care as needed.  Follow-up with your primary care provider in the next 1 to 2 weeks to ensure symptoms have improved.  I hope you feel better!      ED Prescriptions     Medication Sig Dispense Auth. Provider   amoxicillin-clavulanate (AUGMENTIN) 875-125 MG tablet Take 1 tablet by mouth every 12 (twelve) hours. 14 tablet Carlisle Beers, FNP      PDMP not reviewed this encounter.   Carlisle Beers, [place] 01/18/23 1850

## 2023-01-18 NOTE — ED Triage Notes (Signed)
Pain, swelling to right ear for 2 days.  Intermittent muffled hearing.    Denies having used any medications or drops for ear issue

## 2023-01-18 NOTE — Discharge Instructions (Addendum)
I am concerned you may have an abscess to the tragus of your right ear.   Take Augmentin antibiotic twice daily for the next 7 days.  You may take ibuprofen as needed for pain.  Apply warm compresses to the right ear to reduce swelling and irritation to the abscess.  If you develop fever, chills, worsening swelling, erythema, or drainage to the abscess, or any other new or worsening symptoms, please go to the nearest emergency department for reevaluation.  Otherwise, return to urgent care as needed.  Follow-up with your primary care provider in the next 1 to 2 weeks to ensure symptoms have improved.  I hope you feel better!

## 2024-04-30 ENCOUNTER — Other Ambulatory Visit: Payer: Self-pay

## 2024-04-30 ENCOUNTER — Encounter (HOSPITAL_COMMUNITY): Payer: Self-pay | Admitting: Emergency Medicine

## 2024-04-30 ENCOUNTER — Emergency Department (HOSPITAL_COMMUNITY)
Admission: EM | Admit: 2024-04-30 | Discharge: 2024-04-30 | Disposition: A | Discharge status: Home / Self Care | Payer: Worker's Compensation | Attending: Emergency Medicine | Admitting: Emergency Medicine

## 2024-04-30 ENCOUNTER — Emergency Department (HOSPITAL_COMMUNITY): Payer: Worker's Compensation

## 2024-04-30 DIAGNOSIS — M545 Low back pain, unspecified: Secondary | ICD-10-CM | POA: Diagnosis present

## 2024-04-30 DIAGNOSIS — W010XXA Fall on same level from slipping, tripping and stumbling without subsequent striking against object, initial encounter: Secondary | ICD-10-CM | POA: Diagnosis not present

## 2024-04-30 DIAGNOSIS — Y93H2 Activity, gardening and landscaping: Secondary | ICD-10-CM | POA: Diagnosis not present

## 2024-04-30 DIAGNOSIS — Y990 Civilian activity done for income or pay: Secondary | ICD-10-CM | POA: Insufficient documentation

## 2024-04-30 DIAGNOSIS — W19XXXA Unspecified fall, initial encounter: Secondary | ICD-10-CM

## 2024-04-30 MED ORDER — OXYCODONE-ACETAMINOPHEN 5-325 MG PO TABS: 1.0000 | ORAL_TABLET | Freq: Three times a day (TID) | ORAL | 0 refills | Status: AC | PRN

## 2024-04-30 MED ORDER — LIDOCAINE 5 % EX PTCH
1.0000 | MEDICATED_PATCH | CUTANEOUS | Status: DC
  Administered 2024-04-30: 1 via TRANSDERMAL
  Filled 2024-04-30: qty 1

## 2024-04-30 MED ORDER — OXYCODONE-ACETAMINOPHEN 5-325 MG PO TABS
1.0000 | ORAL_TABLET | Freq: Once | ORAL | Status: AC
  Administered 2024-04-30: 1 via ORAL
  Filled 2024-04-30: qty 1

## 2024-04-30 MED ORDER — MELOXICAM 7.5 MG PO TABS: 7.5000 mg | ORAL_TABLET | Freq: Every day | ORAL | 0 refills | Status: AC

## 2024-04-30 NOTE — ED Triage Notes (Signed)
 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 Urgent care sent him here for a CT scan.

## 2024-04-30 NOTE — Discharge Instructions (Addendum)
 Thank for this evaluate you today.  Your CT scan did not show any fractures.  It did show some compression on some of your nerve roots and discs in your lower back.  Provided you with nonemergent neurosurgery/spine specialist follow-up if you start to become more symptomatic having more increased flares of sciatica.  I did send pain medicine to the 24-hour pharmacy for you to pick up tonight.  You may use ice, heat, topical Voltaren gel, topical lidocaine  patches for pain and areas of specific pain  Return to Emergency Department if you experience loss of sensation in your legs, inability to walk, urinary incontinence where you pee or yourself, loss of sensation in genital region, back pain with fever and no other source of infection, significant worsening of symptoms

## 2024-04-30 NOTE — ED Provider Notes (Signed)
 [facility] AT [facility] Provider Note   CSN: [id number] Arrival date & time: 04/30/24  1609     Patient presents with: Fall and Back Pain   David Yates is a 58 y.o. male presents to Emergency Department for evaluation of lower back pain following a fall.  Reports that he was cutting the grass when he slipped and fell onto his bottom onto grass and rolled sideways a couple of times.  Did not hit his head nor have LOC.  Initially sought UC evaluation in [facility] but recommended ED evaluation for CT scan of lumbar spine.  Denies complaints prior to fall, saddle paresthesia, urinary symptoms, urinary incontinence, history of IVDU, malignancy, fevers    Fall  Back Pain      Prior to Admission medications   Medication Sig Start Date End Date Taking? Authorizing Provider  meloxicam  (MOBIC ) 7.5 MG tablet Take 1 tablet (7.5 mg total) by mouth daily. 04/30/24  Yes Minnie Tinnie BRAVO, PA  oxyCODONE -acetaminophen  (PERCOCET/ROXICET) 5-325 MG tablet Take 1 tablet by mouth every 8 (eight) hours as needed for up to 5 days for severe pain (pain score 7-10). 04/30/24 05/05/24 Yes Minnie Tinnie BRAVO, PA  amoxicillin -clavulanate (AUGMENTIN ) 875-125 MG tablet Take 1 tablet by mouth every 12 (twelve) hours. 01/18/23   Enedelia Dorna HERO, FNP  atorvastatin (LIPITOR) 10 MG tablet Take 10 mg by mouth daily. 12/22/22   [provider]  carbamide peroxide (DEBROX) 6.5 % OTIC solution Place 5 drops into the right ear 2 (two) times daily. Patient not taking: Reported on 01/18/2023 08/01/22   Renae Bernarda HERO, PA-C  JARDIANCE 25 MG TABS tablet Take 25 mg by mouth daily. 01/17/23   [provider]  ketoconazole  (NIZORAL ) 2 % cream Apply 1 fingertip amount to each foot twice daily; do not apply between toes Patient not taking: Reported on 01/18/2023 05/30/19   Price, Michael J, DPM  metFORMIN (GLUCOPHAGE) 1000 MG tablet Take 1,000 mg by mouth 2 (two) times daily.  12/30/22   [provider]  naproxen  (NAPROSYN ) 500 MG tablet Take 1 tablet (500 mg total) by mouth as needed. Patient not taking: Reported on 01/18/2023 03/30/18   Soto, Johana, PA-C  predniSONE  (STERAPRED UNI-PAK 21 TAB) 10 MG (21) TBPK tablet Take by mouth daily. Take 6 tabs by mouth daily  for 2 days, then 5 tabs for 2 days, then 4 tabs for 2 days, then 3 tabs for 2 days, 2 tabs for 2 days, then 1 tab by mouth daily for 2 days Patient not taking: Reported on 01/18/2023 03/30/18   Soto, Johana, PA-C    Allergies: Patient has no known allergies.    Review of Systems  Musculoskeletal:  Positive for back pain.    Updated Vital Signs BP (!) 147/72 (BP Location: Left Arm)   Pulse 80   Temp 98.7 F (37.1 C) (Oral)   Resp 16   SpO2 99%   Physical Exam Vitals and nursing note reviewed.  Constitutional:      General: He is not in acute distress.    Appearance: Normal appearance.  HENT:     Head: Normocephalic and atraumatic.  Eyes:     Conjunctiva/sclera: Conjunctivae normal.  Cardiovascular:     Rate and Rhythm: Normal rate.     Pulses:          Dorsalis pedis pulses are 2+ on the right side and 2+ on the left side.  Pulmonary:     Effort:  Pulmonary effort is normal. No respiratory distress.     Breath sounds: Normal breath sounds.  Abdominal:     General: Bowel sounds are normal. There is no distension.     Palpations: Abdomen is soft.     Tenderness: There is no abdominal tenderness. There is no guarding or rebound.  Musculoskeletal:     Cervical back: No tenderness, bony tenderness or crepitus. No pain with movement.     Thoracic back: No tenderness or bony tenderness. Normal range of motion.     Lumbar back: Bony tenderness present. No tenderness.  Skin:    Coloration: Skin is not jaundiced or pale.     Comments: No ecchymosis to chest, abdomen, back, nor retroperitoneum  Neurological:     Mental Status: He is alert and oriented to person, place, and time. Mental  status is at baseline.     Sensory: No sensory deficit.     Motor: No weakness.     Comments: Motor 5/5 and sensation 2/2 of BLE     (all labs ordered are listed, but only abnormal results are displayed) Labs Reviewed - No data to display  EKG: None  Radiology: CT Lumbar Spine Wo Contrast Result Date: 04/30/2024 CLINICAL DATA:  Fall, back pain EXAM: CT LUMBAR SPINE WITHOUT CONTRAST TECHNIQUE: Multidetector CT imaging of the lumbar spine was performed without intravenous contrast administration. Multiplanar CT image reconstructions were also generated. RADIATION DOSE REDUCTION: This exam was performed according to the departmental dose-optimization program which includes automated exposure control, adjustment of the mA and/or kV according to patient size and/or use of iterative reconstruction technique. COMPARISON:  CT scan 04/14/2007 FINDINGS: Segmentation: The lowest lumbar type non-rib-bearing vertebra is labeled as L5. Alignment: No vertebral subluxation is observed. Vertebrae: Congenitally short pedicles in the lumbar spine. 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 Pseudo articulating anterior interbody spurring L4-5. Loss of disc height at L4-5 and L5-S1 with vacuum disc phenomenon at all levels between L3 and S1. No acute bony findings. Paraspinal and other soft tissues: Atherosclerosis is present, including aortoiliac atherosclerotic disease. Disc levels: L1-2: No impingement.  Mild disc bulge L2-3: Moderate central stenosis and borderline bilateral foraminal stenosis due to disc osteophyte complex, facet arthropathy, and short pedicles. L3-4: Moderate to severe central stenosis with moderate right and borderline left foraminal stenosis due to disc bulge, intervertebral spurring, facet arthropathy, and short pedicles. L4-5: Moderate to severe central stenosis with borderline bilateral foraminal stenosis due to disc osteophyte complex and facet arthropathy along with  short pedicles. L5-S1: Moderate central stenosis with moderate right and borderline left foraminal stenosis due to disc osteophyte complex, short pedicles, and facet arthropathy. IMPRESSION: 1. Lumbar spondylosis, degenerative disc disease, and congenitally short pedicles causing moderate to severe impingement at L3-4 and L4-5; and moderate impingement at L2-3 and L5-S1. 2. 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 3. No acute bony findings. 4.  Aortic Atherosclerosis (ICD10-I70.0). Electronically Signed   By: Ryan Salvage M.D.   On: 04/30/2024 18:17     Medications Ordered in the ED  lidocaine  (LIDODERM ) 5 % 1 patch (1 patch Transdermal Patch Applied 04/30/24 1704)  oxyCODONE -acetaminophen  (PERCOCET/ROXICET) 5-325 MG per tablet 1 tablet (1 tablet Oral Given 04/30/24 1704)                                    Medical Decision Making Amount and/or  Complexity of Data Reviewed Radiology: ordered.  Risk Prescription drug management.   Patient presents to the ED for concern of low back pain, this involves an extensive number of treatment options, and is a complaint that carries with it a high risk of complications and morbidity.  The differential diagnosis includes compression fracture, contusion, dislocation, herniated disc, cauda equina   Co morbidities that complicate the patient evaluation  None   Additional history obtained:  Additional history obtained from Nursing   External records from outside source obtained and reviewed including triage note    Imaging Studies ordered:  I ordered imaging studies including CT lumbar spine without contrast I independently visualized and interpreted imaging which showed  Lumbar spondylosis, degenerative disc disease, and congenitally short pedicles causing moderate to severe impingement at L3-4 and L4-5; and moderate impingement at L2-3 and L5-S1. Old nonunited fractures of the right L2 and L3 transverse  processes. No acute bony findings. Aortic Atherosclerosis I agree with the radiologist interpretation     Medicines ordered and prescription drug management:  I ordered medication including Percocet, lidocaine  patch, meloxicam  for pain Reevaluation of the patient after these medicines showed that the patient improved I have reviewed the patients home medicines and have made adjustments as needed     Problem List / ED Course:  Lumbar spine pain No alarm symptoms, fevers, urinary incontinence, urinary symptoms, saddle paresthesia, history of malignancy or IVDU Mechanical fall with TTP of lumbar spine.  No paraspinous muscular tenderness. Neuro intact with no sensory nor motor deficits of BLE.  No sciatica Provided patient with Percocet, lidocaine  patch for pain-improved CT imaging negative for acute injury.  There is moderate to severe impingement noted on CT.  Will provide neurosurgery follow-up Provided meloxicam , Percocet prescription for pain Discussed symptomatic treatment   Reevaluation:  After the interventions noted above, I reevaluated the patient and found that they have :improved   Dispostion:  After consideration of the diagnostic results and the patients response to treatment, I feel that the patent would benefit from outpatient management symptomatic care.   Discussed ED workup, disposition, return to ED precautions with patient who expresses understanding agrees with plan.  All questions answered to their satisfaction.  They are agreeable to plan.  Discharge instructions provided on paperwork  Final diagnoses:  Fall, initial encounter  Acute midline low back pain without sciatica    ED Discharge Orders          Ordered    meloxicam  (MOBIC ) 7.5 MG tablet  Daily        04/30/24 1850    oxyCODONE -acetaminophen  (PERCOCET/ROXICET) 5-325 MG tablet  Every 8 hours PRN        04/30/24 1850             Minnie Tinnie BRAVO, PA 04/30/24 2259    Darra Fonda MATSU, MD 05/04/24 [phone]
